# Patient Record
Sex: Female | Born: 1970 | Hispanic: No | Smoking: Current every day smoker
Health system: Southern US, Community
[De-identification: ages and names within clinical notes are randomized; demographics above are authoritative.]

## PROBLEM LIST (undated history)

## (undated) DIAGNOSIS — Z3491 Encounter for supervision of normal pregnancy, unspecified, first trimester: Principal | ICD-10-CM

## (undated) DIAGNOSIS — B9689 Other specified bacterial agents as the cause of diseases classified elsewhere: Secondary | ICD-10-CM

## (undated) DIAGNOSIS — N898 Other specified noninflammatory disorders of vagina: Secondary | ICD-10-CM

## (undated) DIAGNOSIS — R109 Unspecified abdominal pain: Secondary | ICD-10-CM

## (undated) DIAGNOSIS — O039 Complete or unspecified spontaneous abortion without complication: Secondary | ICD-10-CM

## (undated) DIAGNOSIS — O09529 Supervision of elderly multigravida, unspecified trimester: Secondary | ICD-10-CM

## (undated) DIAGNOSIS — E756 Lipid storage disorder, unspecified: Secondary | ICD-10-CM

## (undated) DIAGNOSIS — M25519 Pain in unspecified shoulder: Secondary | ICD-10-CM

## (undated) DIAGNOSIS — E8809 Other disorders of plasma-protein metabolism, not elsewhere classified: Secondary | ICD-10-CM

## (undated) DIAGNOSIS — Z3490 Encounter for supervision of normal pregnancy, unspecified, unspecified trimester: Secondary | ICD-10-CM

## (undated) DIAGNOSIS — N760 Acute vaginitis: Secondary | ICD-10-CM

## (undated) HISTORY — DX: Encounter for supervision of normal pregnancy, unspecified, unspecified trimester: Z34.90

## (undated) HISTORY — DX: Encounter for supervision of normal pregnancy, unspecified, first trimester: Z34.91

## (undated) HISTORY — DX: Unspecified abdominal pain: R10.9

## (undated) HISTORY — DX: Other specified noninflammatory disorders of vagina: N89.8

## (undated) HISTORY — PX: ANKLE SURGERY: SHX546

## (undated) HISTORY — PX: TONSILLECTOMY AND ADENOIDECTOMY: SHX28

## (undated) HISTORY — DX: Acute vaginitis: N76.0

## (undated) HISTORY — DX: Other specified bacterial agents as the cause of diseases classified elsewhere: B96.89

## (undated) HISTORY — DX: Complete or unspecified spontaneous abortion without complication: O03.9

## (undated) HISTORY — DX: Supervision of elderly multigravida, unspecified trimester: O09.529

## (undated) HISTORY — DX: Pain in unspecified shoulder: M25.519

---

## 2013-12-17 ENCOUNTER — Ambulatory Visit (INDEPENDENT_AMBULATORY_CARE_PROVIDER_SITE_OTHER): Payer: 59

## 2013-12-17 ENCOUNTER — Encounter (INDEPENDENT_AMBULATORY_CARE_PROVIDER_SITE_OTHER): Payer: Self-pay

## 2013-12-17 ENCOUNTER — Encounter: Payer: Self-pay | Admitting: Obstetrics & Gynecology

## 2013-12-17 ENCOUNTER — Ambulatory Visit (INDEPENDENT_AMBULATORY_CARE_PROVIDER_SITE_OTHER): Payer: 59 | Admitting: Obstetrics & Gynecology

## 2013-12-17 ENCOUNTER — Other Ambulatory Visit: Payer: Self-pay | Admitting: Obstetrics & Gynecology

## 2013-12-17 VITALS — BP 140/82 | Ht 62.0 in | Wt 166.0 lb

## 2013-12-17 DIAGNOSIS — O3680X0 Pregnancy with inconclusive fetal viability, not applicable or unspecified: Secondary | ICD-10-CM

## 2013-12-17 DIAGNOSIS — O09529 Supervision of elderly multigravida, unspecified trimester: Secondary | ICD-10-CM

## 2013-12-17 DIAGNOSIS — Z3201 Encounter for pregnancy test, result positive: Secondary | ICD-10-CM

## 2013-12-17 LAB — POCT URINE PREGNANCY: PREG TEST UR: POSITIVE

## 2013-12-17 NOTE — Progress Notes (Signed)
U/S(5+4wks)-transvaginal u/s performed, single IUP with GS and +YS noted, no embryo noted on today's exam, GS measurements c/w LMP dates, cx appears closed, bilateral adnexa appears wnl with C.L. Noted on Rt, no free fluid noted within pelvis, would like to reck to confirm viability

## 2013-12-17 NOTE — Progress Notes (Signed)
Pt here for pregnancy test. Positive result. Pt reports no complaints at this time. Advised some spotting and cramping can be normal in early pregnancy. Advised if she noticed any, call office. Pt voiced understanding. JSY

## 2013-12-23 ENCOUNTER — Other Ambulatory Visit: Payer: Self-pay | Admitting: Obstetrics & Gynecology

## 2013-12-23 DIAGNOSIS — O3680X0 Pregnancy with inconclusive fetal viability, not applicable or unspecified: Secondary | ICD-10-CM

## 2013-12-24 ENCOUNTER — Other Ambulatory Visit: Payer: Self-pay | Admitting: Obstetrics & Gynecology

## 2013-12-24 ENCOUNTER — Other Ambulatory Visit (HOSPITAL_COMMUNITY)
Admission: RE | Admit: 2013-12-24 | Discharge: 2013-12-24 | Disposition: A | Discharge status: Home / Self Care | Payer: 59 | Source: Ambulatory Visit | Attending: Adult Health | Admitting: Adult Health

## 2013-12-24 ENCOUNTER — Ambulatory Visit (INDEPENDENT_AMBULATORY_CARE_PROVIDER_SITE_OTHER): Payer: 59

## 2013-12-24 ENCOUNTER — Ambulatory Visit (INDEPENDENT_AMBULATORY_CARE_PROVIDER_SITE_OTHER): Payer: 59 | Admitting: Adult Health

## 2013-12-24 ENCOUNTER — Encounter: Payer: Self-pay | Admitting: Adult Health

## 2013-12-24 VITALS — BP 138/82 | Wt 168.0 lb

## 2013-12-24 DIAGNOSIS — Z113 Encounter for screening for infections with a predominantly sexual mode of transmission: Secondary | ICD-10-CM | POA: Insufficient documentation

## 2013-12-24 DIAGNOSIS — Z331 Pregnant state, incidental: Secondary | ICD-10-CM

## 2013-12-24 DIAGNOSIS — Z01419 Encounter for gynecological examination (general) (routine) without abnormal findings: Secondary | ICD-10-CM

## 2013-12-24 DIAGNOSIS — O09529 Supervision of elderly multigravida, unspecified trimester: Secondary | ICD-10-CM

## 2013-12-24 DIAGNOSIS — O3680X0 Pregnancy with inconclusive fetal viability, not applicable or unspecified: Secondary | ICD-10-CM

## 2013-12-24 DIAGNOSIS — Z1389 Encounter for screening for other disorder: Secondary | ICD-10-CM

## 2013-12-24 DIAGNOSIS — Z3480 Encounter for supervision of other normal pregnancy, unspecified trimester: Secondary | ICD-10-CM

## 2013-12-24 DIAGNOSIS — Z1151 Encounter for screening for human papillomavirus (HPV): Secondary | ICD-10-CM | POA: Insufficient documentation

## 2013-12-24 DIAGNOSIS — Z3491 Encounter for supervision of normal pregnancy, unspecified, first trimester: Secondary | ICD-10-CM

## 2013-12-24 HISTORY — DX: Encounter for supervision of normal pregnancy, unspecified, first trimester: Z34.91

## 2013-12-24 HISTORY — DX: Supervision of elderly multigravida, unspecified trimester: O09.529

## 2013-12-24 LAB — POCT URINALYSIS DIPSTICK
Glucose, UA: NEGATIVE
Ketones, UA: NEGATIVE
LEUKOCYTES UA: NEGATIVE
Nitrite, UA: NEGATIVE
PROTEIN UA: NEGATIVE
RBC UA: NEGATIVE

## 2013-12-24 LAB — CBC
HCT: 40 % (ref 36.0–46.0)
HEMOGLOBIN: 13.6 g/dL (ref 12.0–15.0)
MCH: 30.9 pg (ref 26.0–34.0)
MCHC: 34 g/dL (ref 30.0–36.0)
MCV: 90.9 fL (ref 78.0–100.0)
Platelets: 383 10*3/uL (ref 150–400)
RBC: 4.4 MIL/uL (ref 3.87–5.11)
RDW: 13 % (ref 11.5–15.5)
WBC: 10.7 10*3/uL — ABNORMAL HIGH (ref 4.0–10.5)

## 2013-12-24 NOTE — Progress Notes (Signed)
U/S-single IUP noted, Intrauterine GS noted with +embryo &  YS noted, +CA noted although unable to pick up with Doppler, cx appears closed, bilateral adnexa appears wnl, would like to reck for definite viability

## 2013-12-24 NOTE — Patient Instructions (Signed)

## 2013-12-24 NOTE — Progress Notes (Signed)
  Subjective:    Kimberly DanesKristi Eaton is a [id number] [id number] being seen today for her first obstetrical visit.  Her obstetrical history is significant for advanced maternal age.  Pregnancy history fully reviewed.  Patient reports no complaints.  Filed Vitals:   12/24/13 1259  BP: 138/82  Weight: 168 lb (76.204 kg)    HISTORY: OB History  Gravida Para Term Preterm AB SAB TAB Ectopic Multiple Living  3 2 2       2     # Outcome Date GA Lbr Len/2nd Weight Sex Delivery Anes PTL Lv  3 CUR           2 TRM 06/24/02 7358w0d  6 lb 3 oz (2.807 kg) M SVD None  Y  1 TRM 02/21/99 8417w0d  5 lb 8 oz (2.495 kg) M SVD None  Y     Past Medical History  Diagnosis Date  . Supervision of normal pregnancy in first trimester 12/24/2013   Past Surgical History  Procedure Laterality Date  . Tonsillectomy and adenoidectomy    . Ankle surgery Left    Family History  Problem Relation Age of Onset  . Diabetes Maternal Uncle   . Cancer Maternal Grandmother   . Cancer Maternal Grandfather     skin  . Stroke Maternal Uncle      Exam       Pelvic Exam:    Perineum: Normal Perineum   Vulva: normal   Vagina:  normal mucosa, normal discharge, no palpable nodules   Uterus 6 weeeks        Cervix: Normal pap with HPV and GC/CHL done   Adnexa: Not palpable   Urinary:  urethral meatus normal    System: Breast:  deferred   Skin: normal coloration and turgor, no rashes    Neurologic: oriented, normal, normal mood   Extremities: normal strength, tone, and muscle mass   HEENT PERRLA  normal thyroid   Mouth/Teeth mucous membranes moist, pharynx normal without lesions   Neck supple and no masses   Cardiovascular: regular rate and rhythm   Respiratory:  appears well, vitals normal, no respiratory distress, acyanotic, normal RR   Abdomen: soft, non-tender; bowel sounds normal; no masses,  no organomegaly          Assessment:    Pregnancy: [id number] Patient Active Problem List   Diagnosis Date Noted  .  Supervision of normal pregnancy in first trimester 12/24/2013        Plan:     Initial labs drawn. Prenatal vitamins. Problem list reviewed and updated. Genetic Screening discussed Integrated Screen: declined.CF declined, flu shot declined  Ultrasound discussed; fetal survey: requested.  Follow up in 1 weeks for US and see me [facility] Wiley Magan,NP 12/24/2013

## 2013-12-25 LAB — DRUG SCREEN, URINE, NO CONFIRMATION
Amphetamine Screen, Ur: NEGATIVE
Barbiturate Quant, Ur: NEGATIVE
Benzodiazepines.: NEGATIVE
Cocaine Metabolites: NEGATIVE
Creatinine,U: 266.2 mg/dL
Marijuana Metabolite: NEGATIVE
Methadone: NEGATIVE
Opiate Screen, Urine: NEGATIVE
Phencyclidine (PCP): NEGATIVE
Propoxyphene: NEGATIVE

## 2013-12-25 LAB — TSH: TSH: 1.417 u[IU]/mL (ref 0.350–4.500)

## 2013-12-25 LAB — URINALYSIS, ROUTINE W REFLEX MICROSCOPIC
Bilirubin Urine: NEGATIVE
GLUCOSE, UA: NEGATIVE mg/dL
Hgb urine dipstick: NEGATIVE
Ketones, ur: NEGATIVE mg/dL
Leukocytes, UA: NEGATIVE
Nitrite: NEGATIVE
PH: 5.5 (ref 5.0–8.0)
Protein, ur: NEGATIVE mg/dL
Specific Gravity, Urine: 1.028 (ref 1.005–1.030)
Urobilinogen, UA: 0.2 mg/dL (ref 0.0–1.0)

## 2013-12-25 LAB — HIV ANTIBODY (ROUTINE TESTING W REFLEX): HIV: NONREACTIVE

## 2013-12-25 LAB — OXYCODONE SCREEN, UA, RFLX CONFIRM: OXYCODONE SCRN UR: NEGATIVE ng/mL

## 2013-12-25 LAB — VARICELLA ZOSTER ANTIBODY, IGG: Varicella IgG: <10 Index (ref <135.00)

## 2013-12-25 LAB — ABO AND RH: Rh Type: POSITIVE

## 2013-12-25 LAB — RUBELLA SCREEN: Rubella: 29.7 {index} — ABNORMAL HIGH

## 2013-12-25 LAB — HEPATITIS B SURFACE ANTIGEN: HEP B S AG: NEGATIVE

## 2013-12-25 LAB — ANTIBODY SCREEN: Antibody Screen: NEGATIVE

## 2013-12-25 LAB — RPR

## 2013-12-26 LAB — URINE CULTURE
Colony Count: NO GROWTH
Organism ID, Bacteria: NO GROWTH

## 2013-12-31 ENCOUNTER — Ambulatory Visit (INDEPENDENT_AMBULATORY_CARE_PROVIDER_SITE_OTHER): Payer: 59 | Admitting: Adult Health

## 2013-12-31 ENCOUNTER — Other Ambulatory Visit: Payer: Self-pay | Admitting: Adult Health

## 2013-12-31 ENCOUNTER — Ambulatory Visit (INDEPENDENT_AMBULATORY_CARE_PROVIDER_SITE_OTHER): Payer: 59

## 2013-12-31 VITALS — BP 138/78 | Wt 169.0 lb

## 2013-12-31 DIAGNOSIS — Z1389 Encounter for screening for other disorder: Secondary | ICD-10-CM

## 2013-12-31 DIAGNOSIS — O09529 Supervision of elderly multigravida, unspecified trimester: Secondary | ICD-10-CM

## 2013-12-31 DIAGNOSIS — O3680X0 Pregnancy with inconclusive fetal viability, not applicable or unspecified: Secondary | ICD-10-CM

## 2013-12-31 DIAGNOSIS — Z331 Pregnant state, incidental: Secondary | ICD-10-CM

## 2013-12-31 DIAGNOSIS — Z3491 Encounter for supervision of normal pregnancy, unspecified, first trimester: Secondary | ICD-10-CM

## 2013-12-31 LAB — POCT URINALYSIS DIPSTICK
Glucose, UA: NEGATIVE
KETONES UA: NEGATIVE
Nitrite, UA: NEGATIVE
Protein, UA: NEGATIVE
RBC UA: NEGATIVE

## 2013-12-31 NOTE — Patient Instructions (Signed)
Follow up in 1 week for US Call Monday for labs

## 2013-12-31 NOTE — Progress Notes (Signed)
U/S-single IUP with +FCA noted FHR-94bpm, CRL (3.441mm) c/w 5+5 wks, GS measurements c/w 7+2wks, +YS noted, cx appears closed, bilateral adnexa appears wnl, no free fluid noted within pelvis, maternal HR-74 bpm

## 2013-12-31 NOTE — Progress Notes (Signed)
No bleeding or cramping, had US with +FHM but growth lagging, will check progesterone level and follow up with US in 1 week

## 2014-01-01 LAB — PROGESTERONE: Progesterone: 9.3 ng/mL

## 2014-01-03 ENCOUNTER — Telehealth: Payer: Self-pay | Admitting: Adult Health

## 2014-01-03 MED ORDER — PROGESTERONE MICRONIZED 100 MG PO CAPS: ORAL_CAPSULE | ORAL | Status: DC

## 2014-01-03 NOTE — Telephone Encounter (Signed)
Progesterone level 9.3 called in prometrium 100 mg 1 at hs in vagina

## 2014-01-07 ENCOUNTER — Ambulatory Visit (INDEPENDENT_AMBULATORY_CARE_PROVIDER_SITE_OTHER): Payer: 59 | Admitting: Adult Health

## 2014-01-07 ENCOUNTER — Encounter: Payer: Self-pay | Admitting: Adult Health

## 2014-01-07 VITALS — BP 120/80 | Wt 168.0 lb

## 2014-01-07 DIAGNOSIS — Z1389 Encounter for screening for other disorder: Secondary | ICD-10-CM

## 2014-01-07 DIAGNOSIS — O039 Complete or unspecified spontaneous abortion without complication: Secondary | ICD-10-CM

## 2014-01-07 DIAGNOSIS — Z331 Pregnant state, incidental: Secondary | ICD-10-CM

## 2014-01-07 HISTORY — DX: Complete or unspecified spontaneous abortion without complication: O03.9

## 2014-01-07 LAB — POCT URINALYSIS DIPSTICK
Blood, UA: NEGATIVE
GLUCOSE UA: NEGATIVE
KETONES UA: NEGATIVE
Leukocytes, UA: NEGATIVE
Nitrite, UA: NEGATIVE
Protein, UA: NEGATIVE

## 2014-01-07 NOTE — Progress Notes (Signed)
Has CRL 0.40cm was 3.1 mm 12/31/13 no FCA will get US in 10 days,Pt aware this is miscarriage, can stop prometrium, discussed with Dr Despina HiddenEure.

## 2014-01-07 NOTE — Progress Notes (Signed)
Pt denies any problems or concerns at this time.  

## 2014-01-07 NOTE — Patient Instructions (Signed)
Miscarriage A miscarriage is the sudden loss of an unborn baby (fetus) before the 20th week of pregnancy. Most miscarriages happen in the first 3 months of pregnancy. Sometimes, it happens before a woman even knows she is pregnant. A miscarriage is also called a "spontaneous miscarriage" or "early pregnancy loss." Having a miscarriage can be an emotional experience. Talk with your caregiver about any questions you may have about miscarrying, the grieving process, and your future pregnancy plans. CAUSES   Problems with the fetal chromosomes that make it impossible for the baby to develop normally. Problems with the baby's genes or chromosomes are most often the result of errors that occur, by chance, as the embryo divides and grows. The problems are not inherited from the parents.  Infection of the cervix or uterus.   Hormone problems.   Problems with the cervix, such as having an incompetent cervix. This is when the tissue in the cervix is not strong enough to hold the pregnancy.   Problems with the uterus, such as an abnormally shaped uterus, uterine fibroids, or congenital abnormalities.   Certain medical conditions.   Smoking, drinking alcohol, or taking illegal drugs.   Trauma.  Often, the cause of a miscarriage is unknown.  SYMPTOMS   Vaginal bleeding or spotting, with or without cramps or pain.  Pain or cramping in the abdomen or lower back.  Passing fluid, tissue, or blood clots from the vagina. DIAGNOSIS  Your caregiver will perform a physical exam. You may also have an ultrasound to confirm the miscarriage. Blood or urine tests may also be ordered. TREATMENT   Sometimes, treatment is not necessary if you naturally pass all the fetal tissue that was in the uterus. If some of the fetus or placenta remains in the body (incomplete miscarriage), tissue left behind may become infected and must be removed. Usually, a dilation and curettage (D and C) procedure is performed.  During a D and C procedure, the cervix is widened (dilated) and any remaining fetal or placental tissue is gently removed from the uterus.  Antibiotic medicines are prescribed if there is an infection. Other medicines may be given to reduce the size of the uterus (contract) if there is a lot of bleeding.  If you have Rh negative blood and your baby was Rh positive, you will need a Rh immunoglobulin shot. This shot will protect any future baby from having Rh blood problems in future pregnancies. HOME CARE INSTRUCTIONS   Your caregiver may order bed rest or may allow you to continue light activity. Resume activity as directed by your caregiver.  Have someone help with home and family responsibilities during this time.   Keep track of the number of sanitary pads you use each day and how soaked (saturated) they are. Write down this information.   Do not use tampons. Do not douche or have sexual intercourse until approved by your caregiver.   Only take over-the-counter or prescription medicines for pain or discomfort as directed by your caregiver.   Do not take aspirin. Aspirin can cause bleeding.   Keep all follow-up appointments with your caregiver.   If you or your partner have problems with grieving, talk to your caregiver or seek counseling to help cope with the pregnancy loss. Allow enough time to grieve before trying to get pregnant again.  SEEK IMMEDIATE MEDICAL CARE IF:   You have severe cramps or pain in your back or abdomen.  You have a fever.  You pass large blood clots (walnut-sized   or larger) ortissue from your vagina. Save any tissue for your caregiver to inspect.   Your bleeding increases.   You have a thick, bad-smelling vaginal discharge.  You become lightheaded, weak, or you faint.   You have chills.  MAKE SURE YOU:  Understand these instructions.  Will watch your condition.  Will get help right away if you are not doing well or get  worse. Document Released: 04/30/2001 Document Revised: 03/01/2013 Document Reviewed: 12/24/2011 [facility] Patient Information 2014 [place], [place]. Follow up in 10 days for US  Call prn

## 2014-01-17 ENCOUNTER — Ambulatory Visit (INDEPENDENT_AMBULATORY_CARE_PROVIDER_SITE_OTHER): Payer: 59 | Admitting: Adult Health

## 2014-01-17 ENCOUNTER — Ambulatory Visit (INDEPENDENT_AMBULATORY_CARE_PROVIDER_SITE_OTHER): Payer: 59

## 2014-01-17 ENCOUNTER — Encounter (INDEPENDENT_AMBULATORY_CARE_PROVIDER_SITE_OTHER): Payer: Self-pay

## 2014-01-17 ENCOUNTER — Encounter: Payer: Self-pay | Admitting: Adult Health

## 2014-01-17 VITALS — BP 130/88 | Ht 62.0 in | Wt 170.0 lb

## 2014-01-17 DIAGNOSIS — O039 Complete or unspecified spontaneous abortion without complication: Secondary | ICD-10-CM

## 2014-01-17 DIAGNOSIS — Z3491 Encounter for supervision of normal pregnancy, unspecified, first trimester: Secondary | ICD-10-CM

## 2014-01-17 MED ORDER — MISOPROSTOL 200 MCG PO TABS: ORAL_TABLET | ORAL | Status: DC

## 2014-01-17 MED ORDER — ALPRAZOLAM 0.25 MG PO TABS: 0.2500 mg | ORAL_TABLET | Freq: Every evening | ORAL | Status: DC | PRN

## 2014-01-17 MED ORDER — IBUPROFEN 800 MG PO TABS: 800.0000 mg | ORAL_TABLET | Freq: Three times a day (TID) | ORAL | Status: DC | PRN

## 2014-01-17 MED ORDER — HYDROCODONE-ACETAMINOPHEN 5-325 MG PO TABS: 1.0000 | ORAL_TABLET | Freq: Four times a day (QID) | ORAL | Status: DC | PRN

## 2014-01-17 NOTE — Patient Instructions (Signed)
Miscarriage A miscarriage is the sudden loss of an unborn baby (fetus) before the 20th week of pregnancy. Most miscarriages happen in the first 3 months of pregnancy. Sometimes, it happens before a woman even knows she is pregnant. A miscarriage is also called a "spontaneous miscarriage" or "early pregnancy loss." Having a miscarriage can be an emotional experience. Talk with your caregiver about any questions you may have about miscarrying, the grieving process, and your future pregnancy plans. CAUSES   Problems with the fetal chromosomes that make it impossible for the baby to develop normally. Problems with the baby's genes or chromosomes are most often the result of errors that occur, by chance, as the embryo divides and grows. The problems are not inherited from the parents.  Infection of the cervix or uterus.   Hormone problems.   Problems with the cervix, such as having an incompetent cervix. This is when the tissue in the cervix is not strong enough to hold the pregnancy.   Problems with the uterus, such as an abnormally shaped uterus, uterine fibroids, or congenital abnormalities.   Certain medical conditions.   Smoking, drinking alcohol, or taking illegal drugs.   Trauma.  Often, the cause of a miscarriage is unknown.  SYMPTOMS   Vaginal bleeding or spotting, with or without cramps or pain.  Pain or cramping in the abdomen or lower back.  Passing fluid, tissue, or blood clots from the vagina. DIAGNOSIS  Your caregiver will perform a physical exam. You may also have an ultrasound to confirm the miscarriage. Blood or urine tests may also be ordered. TREATMENT   Sometimes, treatment is not necessary if you naturally pass all the fetal tissue that was in the uterus. If some of the fetus or placenta remains in the body (incomplete miscarriage), tissue left behind may become infected and must be removed. Usually, a dilation and curettage (D and C) procedure is performed.  During a D and C procedure, the cervix is widened (dilated) and any remaining fetal or placental tissue is gently removed from the uterus.  Antibiotic medicines are prescribed if there is an infection. Other medicines may be given to reduce the size of the uterus (contract) if there is a lot of bleeding.  If you have Rh negative blood and your baby was Rh positive, you will need a Rh immunoglobulin shot. This shot will protect any future baby from having Rh blood problems in future pregnancies. HOME CARE INSTRUCTIONS   Your caregiver may order bed rest or may allow you to continue light activity. Resume activity as directed by your caregiver.  Have someone help with home and family responsibilities during this time.   Keep track of the number of sanitary pads you use each day and how soaked (saturated) they are. Write down this information.   Do not use tampons. Do not douche or have sexual intercourse until approved by your caregiver.   Only take over-the-counter or prescription medicines for pain or discomfort as directed by your caregiver.   Do not take aspirin. Aspirin can cause bleeding.   Keep all follow-up appointments with your caregiver.   If you or your partner have problems with grieving, talk to your caregiver or seek counseling to help cope with the pregnancy loss. Allow enough time to grieve before trying to get pregnant again.  SEEK IMMEDIATE MEDICAL CARE IF:   You have severe cramps or pain in your back or abdomen.  You have a fever.  You pass large blood clots (walnut-sized   or larger) ortissue from your vagina. Save any tissue for your caregiver to inspect.   Your bleeding increases.   You have a thick, bad-smelling vaginal discharge.  You become lightheaded, weak, or you faint.   You have chills.  MAKE SURE YOU:  Understand these instructions.  Will watch your condition.  Will get help right away if you are not doing well or get  worse. Document Released: 04/30/2001 Document Revised: 03/01/2013 Document Reviewed: 12/24/2011 [facility] Patient Information 2014 [place], [place]. Take cytotec 4 at once Follow up in 2 weeks

## 2014-01-17 NOTE — Progress Notes (Signed)
Subjective:     Patient ID: Warren DanesKristi McLeod, female   DOB: 07/20/1971, 43 y.o.   MRN: [id number]  HPI Silva BandyKristi is a 43 year old white female back for US for probable miscarriage.She is teary today and says her emotions are all over the place.She desires another pregnancy.   Review of Systems See HPI Reviewed past medical,surgical, social and family history. Reviewed medications and allergies.     Objective:   Physical Exam BP 130/88  Ht 5\' 2"  (1.575 m)  Wt 170 lb (77.111 kg)  BMI 31.09 kg/m2  LMP 11/08/2013   Reviewed US with pt, has 30 mm sac, no fetal pole or FCA seen, has +YS and now has ?hemorrhage beside sac representing evolving miscarriage, discussed options of waiting or using cytotec to speed up process, will proceed with cytotec, and discussed with Dr Despina HiddenEure.  Assessment:     Miscarriage     Plan:    Use condoms, and wait 3 months to try again, keep taking prenatals Rx Cytotec 200 mcg #4 4 po now with 1 refill Rx norco 5/325 mg #15 1 every 6 hours prn pain no refills Rx motrin 800 mg #60 1 every 6-8 hours prn with 1 refill Rx xanax 0.25 mg #30 1 at hs prn no refills Follow up in 2 weeks Review handout on miscarriage

## 2014-01-17 NOTE — Progress Notes (Signed)
U/S-transvaginal u/s performed, single Intrauterine GS noted with +YS noted within, GS meas c/w 8+1wks (30.630mm), No positively identified fetal pole noted, cx appears closed, small amount of hemorrhage noted adjacent to GS, bilateral adnexa/ovaries appears wnl, no free fluid noted

## 2014-01-26 LAB — US OB LIMITED

## 2014-02-01 ENCOUNTER — Encounter (INDEPENDENT_AMBULATORY_CARE_PROVIDER_SITE_OTHER): Payer: Self-pay

## 2014-02-01 ENCOUNTER — Ambulatory Visit (INDEPENDENT_AMBULATORY_CARE_PROVIDER_SITE_OTHER): Payer: 59 | Admitting: Adult Health

## 2014-02-01 ENCOUNTER — Encounter: Payer: Self-pay | Admitting: Adult Health

## 2014-02-01 VITALS — BP 118/72 | Ht 62.0 in | Wt 169.0 lb

## 2014-02-01 DIAGNOSIS — M25519 Pain in unspecified shoulder: Secondary | ICD-10-CM

## 2014-02-01 DIAGNOSIS — O039 Complete or unspecified spontaneous abortion without complication: Secondary | ICD-10-CM

## 2014-02-01 HISTORY — DX: Pain in unspecified shoulder: M25.519

## 2014-02-01 NOTE — Patient Instructions (Signed)
appt with Dr Frederico Hammananiel Caffrey 3/19 at 10:30 am Return in 3 weeks for follow up

## 2014-02-01 NOTE — Progress Notes (Signed)
Subjective:     Patient ID: Kimberly Eaton, female   DOB: 21-Sep-1971, 43 y.o.   MRN: [id number]  HPI Kimberly Eaton is back sp miscarriage, she says she feels better emotionally.  Review of Systems See HPI Reviewed past medical,surgical, social and family history. Reviewed medications and allergies.     Objective:   Physical Exam BP 118/72  Ht 5\' 2"  (1.575 m)  Wt 169 lb (76.658 kg)  BMI 30.90 kg/m2  LMP 11/08/2013   Passed tissue after taking Cytotec, only took once, complains of pain right shoulder when she raises it and has ?bone spur at joint that is point tender.  Assessment:     Miscarriage Pain right shoulder    Plan:     Check QHCG Return in 3 weeks in follow up Refer to Dr Frederico Hammananiel Caffrey 3/19 at 10:30 am to evaluate

## 2014-02-02 LAB — HCG, QUANTITATIVE, PREGNANCY: hCG, Beta Chain, Quant, S: 306.1 m[IU]/mL

## 2014-02-03 ENCOUNTER — Telehealth: Payer: Self-pay | Admitting: Adult Health

## 2014-02-03 NOTE — Telephone Encounter (Signed)
Left message QHCG 306.1, will repeat in 3 weeks at F/U appt

## 2014-02-22 ENCOUNTER — Telehealth: Payer: Self-pay | Admitting: Adult Health

## 2014-02-22 NOTE — Telephone Encounter (Signed)
Doing good will reschedule labs for next week, had shoulder injected and it is good too.

## 2014-02-23 ENCOUNTER — Ambulatory Visit: Payer: 59 | Admitting: Adult Health

## 2014-02-25 ENCOUNTER — Ambulatory Visit: Payer: 59 | Admitting: Adult Health

## 2014-09-19 ENCOUNTER — Encounter: Payer: Self-pay | Admitting: Adult Health

## 2014-10-22 ENCOUNTER — Encounter (HOSPITAL_COMMUNITY): Payer: Self-pay | Admitting: *Deleted

## 2014-11-28 ENCOUNTER — Encounter: Payer: Self-pay | Admitting: Adult Health

## 2014-11-28 ENCOUNTER — Ambulatory Visit (INDEPENDENT_AMBULATORY_CARE_PROVIDER_SITE_OTHER): Payer: 59 | Admitting: Adult Health

## 2014-11-28 VITALS — BP 174/80 | Ht 62.0 in | Wt 168.5 lb

## 2014-11-28 DIAGNOSIS — N898 Other specified noninflammatory disorders of vagina: Secondary | ICD-10-CM

## 2014-11-28 DIAGNOSIS — A499 Bacterial infection, unspecified: Secondary | ICD-10-CM

## 2014-11-28 DIAGNOSIS — N760 Acute vaginitis: Secondary | ICD-10-CM

## 2014-11-28 DIAGNOSIS — R109 Unspecified abdominal pain: Secondary | ICD-10-CM

## 2014-11-28 DIAGNOSIS — B9689 Other specified bacterial agents as the cause of diseases classified elsewhere: Secondary | ICD-10-CM

## 2014-11-28 HISTORY — DX: Other specified noninflammatory disorders of vagina: N89.8

## 2014-11-28 HISTORY — DX: Unspecified abdominal pain: R10.9

## 2014-11-28 HISTORY — DX: Other specified bacterial agents as the cause of diseases classified elsewhere: B96.89

## 2014-11-28 LAB — POCT WET PREP (WET MOUNT): WBC, Wet Prep HPF POC: POSITIVE

## 2014-11-28 MED ORDER — METRONIDAZOLE 500 MG PO TABS: 500.0000 mg | ORAL_TABLET | Freq: Two times a day (BID) | ORAL | Status: DC

## 2014-11-28 NOTE — Progress Notes (Signed)
Subjective:     Patient ID: Kimberly Eaton, female   DOB: 1971-09-13, 44 y.o.   MRN: [id number]  HPI Kimberly Eaton is a 44 year old white female in complaining of stomach cramps and pain with sex depending on position.Still wants to get pregnant.  Review of Systems See HPI Reviewed past medical,surgical, social and family history. Reviewed medications and allergies.     Objective:   Physical Exam BP 174/80 mmHg  Ht 5\' 2"  (1.575 m)  Wt 168 lb 8 oz (76.431 kg)  BMI 30.81 kg/m2  LMP 11/09/2014  Breastfeeding? No   Skin warm and dry.Pelvic: external genitalia is normal in appearance, vagina: white discharge with odor, cervix:smooth and bulbous, uterus: normal size, shape and contour, non tender, no masses felt, adnexa: no masses or tenderness noted. Wet prep: + for clue cells and +WBCs.GC/CHL sent.  Assessment:     Stomach cramps Vaginal discharge BV    Plan:    Rx flagyl 500 mg 1 bid x 7 days, no alcohol, review handout on BV   No sex during treatment Call with next period will check progesterone day 21

## 2014-11-28 NOTE — Addendum Note (Signed)
Addended by: Cyril MourningGRIFFIN, Mckynlee Luse A on: 11/28/2014 03:15 PM   Modules accepted: Orders

## 2014-11-28 NOTE — Patient Instructions (Addendum)
Bacterial Vaginosis Bacterial vaginosis is a vaginal infection that occurs when the normal balance of bacteria in the vagina is disrupted. It results from an overgrowth of certain bacteria. This is the most common vaginal infection in women of childbearing age. Treatment is important to prevent complications, especially in pregnant women, as it can cause a premature delivery. CAUSES  Bacterial vaginosis is caused by an increase in harmful bacteria that are normally present in smaller amounts in the vagina. Several different kinds of bacteria can cause bacterial vaginosis. However, the reason that the condition develops is not fully understood. RISK FACTORS Certain activities or behaviors can put you at an increased risk of developing bacterial vaginosis, including:  Having a new sex partner or multiple sex partners.  Douching.  Using an intrauterine device (IUD) for contraception. Women do not get bacterial vaginosis from toilet seats, bedding, swimming pools, or contact with objects around them. SIGNS AND SYMPTOMS  Some women with bacterial vaginosis have no signs or symptoms. Common symptoms include:  Grey vaginal discharge.  A fishlike odor with discharge, especially after sexual intercourse.  Itching or burning of the vagina and vulva.  Burning or pain with urination. DIAGNOSIS  Your health care provider will take a medical history and examine the vagina for signs of bacterial vaginosis. A sample of vaginal fluid may be taken. Your health care provider will look at this sample under a microscope to check for bacteria and abnormal cells. A vaginal pH test may also be done.  TREATMENT  Bacterial vaginosis may be treated with antibiotic medicines. These may be given in the form of a pill or a vaginal cream. A second round of antibiotics may be prescribed if the condition comes back after treatment.  HOME CARE INSTRUCTIONS   Only take over-the-counter or prescription medicines as  directed by your health care provider.  If antibiotic medicine was prescribed, take it as directed. Make sure you finish it even if you start to feel better.  Do not have sex until treatment is completed.  Tell all sexual partners that you have a vaginal infection. They should see their health care provider and be treated if they have problems, such as a mild rash or itching.  Practice safe sex by using condoms and only having one sex partner. SEEK MEDICAL CARE IF:   Your symptoms are not improving after 3 days of treatment.  You have increased discharge or pain.  You have a fever. MAKE SURE YOU:   Understand these instructions.  Will watch your condition.  Will get help right away if you are not doing well or get worse. FOR MORE INFORMATION  Centers for Disease Control and Prevention, Division of STD Prevention: [employer]s.co.zawww.cdc.gov/std American Sexual Health Association (ASHA): www.ashastd.org  Document Released: 11/04/2005 Document Revised: 08/25/2013 Document Reviewed: 06/16/2013 [facility] Patient Information 2015 [place]ExitCare, [place]LL[place]. This information is not intended to replace advice given to you by your health care provider. Make sure you discuss any questions you have with your health care provider. No alcohol No sex during treatment Call with period

## 2014-11-30 LAB — GC/CHLAMYDIA PROBE AMP
CT PROBE, AMP APTIMA: NEGATIVE
GC Probe RNA: NEGATIVE

## 2014-12-07 ENCOUNTER — Telehealth: Payer: Self-pay | Admitting: Adult Health

## 2014-12-07 NOTE — Telephone Encounter (Signed)
Check progesterone level February 10, will be out of town so get it 2/12

## 2014-12-30 ENCOUNTER — Other Ambulatory Visit: Payer: Medicaid Other

## 2014-12-30 DIAGNOSIS — Z319 Encounter for procreative management, unspecified: Secondary | ICD-10-CM

## 2014-12-31 LAB — PROGESTERONE: Progesterone: 12.1 ng/mL

## 2015-01-03 ENCOUNTER — Telehealth: Payer: Self-pay | Admitting: Adult Health

## 2015-01-03 NOTE — Telephone Encounter (Signed)
Pt aware progesterone 12.1 have sex day 7-24 every other day, [facility], will let me know

## 2015-02-06 ENCOUNTER — Telehealth: Payer: Self-pay | Admitting: Adult Health

## 2015-02-06 MED ORDER — CLOMIPHENE CITRATE 50 MG PO TABS: 50.0000 mg | ORAL_TABLET | Freq: Every day | ORAL | Status: DC

## 2015-02-06 NOTE — Telephone Encounter (Signed)
Started 3/18 wants to try clomid, will rx 50 mg take 1 daily x 5 days, have sex every other day, day 7-24, [facility],

## 2015-04-11 ENCOUNTER — Ambulatory Visit (INDEPENDENT_AMBULATORY_CARE_PROVIDER_SITE_OTHER): Payer: Medicaid Other | Admitting: Adult Health

## 2015-04-11 ENCOUNTER — Encounter: Payer: Self-pay | Admitting: Adult Health

## 2015-04-11 VITALS — BP 136/90 | HR 76 | Ht 62.0 in | Wt 168.0 lb

## 2015-04-11 DIAGNOSIS — Z3201 Encounter for pregnancy test, result positive: Secondary | ICD-10-CM

## 2015-04-11 DIAGNOSIS — O09521 Supervision of elderly multigravida, first trimester: Secondary | ICD-10-CM

## 2015-04-11 DIAGNOSIS — O3680X0 Pregnancy with inconclusive fetal viability, not applicable or unspecified: Secondary | ICD-10-CM

## 2015-04-11 DIAGNOSIS — N926 Irregular menstruation, unspecified: Secondary | ICD-10-CM | POA: Diagnosis not present

## 2015-04-11 DIAGNOSIS — Z3490 Encounter for supervision of normal pregnancy, unspecified, unspecified trimester: Secondary | ICD-10-CM

## 2015-04-11 HISTORY — DX: Encounter for supervision of normal pregnancy, unspecified, unspecified trimester: Z34.90

## 2015-04-11 LAB — POCT URINE PREGNANCY: Preg Test, Ur: POSITIVE

## 2015-04-11 NOTE — Progress Notes (Signed)
Subjective:     Patient ID: Kimberly Eaton, female   DOB: July 01, 1971, 44 y.o.   MRN: [id number]  HPI Kimberly BandyKristi is a 44 year old white female in for UPT, she has used clomid for 2 cycles and had a missed period, with +HPT last week, she has had breast tenderness and nausea and some cramping no bleeding.  Review of Systems See HPI for positives, all other systems negative Reviewed past medical,surgical, social and family history. Reviewed medications and allergies.     Objective:   Physical Exam BP 136/90 mmHg  Pulse 76  Ht 5\' 2"  (1.575 m)  Wt 168 lb (76.204 kg)  BMI 30.72 kg/m2  LMP 03/04/2015 UPT +, about 5+3 weeks by LMP with EDD 12/09/15, medicaid form given, Skin warm and dry. Lungs: clear to ausculation bilaterally. Cardiovascular: regular rate and rhythm.    Assessment:     Pregnant AMA    Plan:    She is already taking prenatal vitamins  Check QHCG and progesterone Return in 2 weeks for dating US Review handout on first trimester

## 2015-04-11 NOTE — Patient Instructions (Signed)
First Trimester of Pregnancy The first trimester of pregnancy is from week 1 until the end of week 12 (months 1 through 3). A week after a sperm fertilizes an egg, the egg will implant on the wall of the uterus. This embryo will begin to develop into a baby. Genes from you and your partner are forming the baby. The female genes determine whether the baby is a boy or a girl. At 6-8 weeks, the eyes and face are formed, and the heartbeat can be seen on ultrasound. At the end of 12 weeks, all the baby's organs are formed.  Now that you are pregnant, you will want to do everything you can to have a healthy baby. Two of the most important things are to get good prenatal care and to follow your health care provider's instructions. Prenatal care is all the medical care you receive before the baby's birth. This care will help prevent, find, and treat any problems during the pregnancy and childbirth. BODY CHANGES Your body goes through many changes during pregnancy. The changes vary from woman to woman.   You may gain or lose a couple of pounds at first.  You may feel sick to your stomach (nauseous) and throw up (vomit). If the vomiting is uncontrollable, call your health care provider.  You may tire easily.  You may develop headaches that can be relieved by medicines approved by your health care provider.  You may urinate more often. Painful urination may mean you have a bladder infection.  You may develop heartburn as a result of your pregnancy.  You may develop constipation because certain hormones are causing the muscles that push waste through your intestines to slow down.  You may develop hemorrhoids or swollen, bulging veins (varicose veins).  Your breasts may begin to grow larger and become tender. Your nipples may stick out more, and the tissue that surrounds them (areola) may become darker.  Your gums may bleed and may be sensitive to brushing and flossing.  Dark spots or blotches (chloasma,  mask of pregnancy) may develop on your face. This will likely fade after the baby is born.  Your menstrual periods will stop.  You may have a loss of appetite.  You may develop cravings for certain kinds of food.  You may have changes in your emotions from day to day, such as being excited to be pregnant or being concerned that something may go wrong with the pregnancy and baby.  You may have more vivid and strange dreams.  You may have changes in your hair. These can include thickening of your hair, rapid growth, and changes in texture. Some women also have hair loss during or after pregnancy, or hair that feels dry or thin. Your hair will most likely return to normal after your baby is born. WHAT TO EXPECT AT YOUR PRENATAL VISITS During a routine prenatal visit:  You will be weighed to make sure you and the baby are growing normally.  Your blood pressure will be taken.  Your abdomen will be measured to track your baby's growth.  The fetal heartbeat will be listened to starting around week 10 or 12 of your pregnancy.  Test results from any previous visits will be discussed. Your health care provider may ask you:  How you are feeling.  If you are feeling the baby move.  If you have had any abnormal symptoms, such as leaking fluid, bleeding, severe headaches, or abdominal cramping.  If you have any questions. Other tests   that may be performed during your first trimester include:  Blood tests to find your blood type and to check for the presence of any previous infections. They will also be used to check for low iron levels (anemia) and Rh antibodies. Later in the pregnancy, blood tests for diabetes will be done along with other tests if problems develop.  Urine tests to check for infections, diabetes, or protein in the urine.  An ultrasound to confirm the proper growth and development of the baby.  An amniocentesis to check for possible genetic problems.  Fetal screens for  spina bifida and Down syndrome.  You may need other tests to make sure you and the baby are doing well. HOME CARE INSTRUCTIONS  Medicines  Follow your health care provider's instructions regarding medicine use. Specific medicines may be either safe or unsafe to take during pregnancy.  Take your prenatal vitamins as directed.  If you develop constipation, try taking a stool softener if your health care provider approves. Diet  Eat regular, well-balanced meals. Choose a variety of foods, such as meat or vegetable-based protein, fish, milk and low-fat dairy products, vegetables, fruits, and whole grain breads and cereals. Your health care provider will help you determine the amount of weight gain that is right for you.  Avoid raw meat and uncooked cheese. These carry germs that can cause birth defects in the baby.  Eating four or five small meals rather than three large meals a day may help relieve nausea and vomiting. If you start to feel nauseous, eating a few soda crackers can be helpful. Drinking liquids between meals instead of during meals also seems to help nausea and vomiting.  If you develop constipation, eat more high-fiber foods, such as fresh vegetables or fruit and whole grains. Drink enough fluids to keep your urine clear or pale yellow. Activity and Exercise  Exercise only as directed by your health care provider. Exercising will help you:  Control your weight.  Stay in shape.  Be prepared for labor and delivery.  Experiencing pain or cramping in the lower abdomen or low back is a good sign that you should stop exercising. Check with your health care provider before continuing normal exercises.  Try to avoid standing for long periods of time. Move your legs often if you must stand in one place for a long time.  Avoid heavy lifting.  Wear low-heeled shoes, and practice good posture.  You may continue to have sex unless your health care provider directs you  otherwise. Relief of Pain or Discomfort  Wear a good support bra for breast tenderness.   Take warm sitz baths to soothe any pain or discomfort caused by hemorrhoids. Use hemorrhoid cream if your health care provider approves.   Rest with your legs elevated if you have leg cramps or low back pain.  If you develop varicose veins in your legs, wear support hose. Elevate your feet for 15 minutes, 3-4 times a day. Limit salt in your diet. Prenatal Care  Schedule your prenatal visits by the twelfth week of pregnancy. They are usually scheduled monthly at first, then more often in the last 2 months before delivery.  Write down your questions. Take them to your prenatal visits.  Keep all your prenatal visits as directed by your health care provider. Safety  Wear your seat belt at all times when driving.  Make a list of emergency phone numbers, including numbers for family, friends, the hospital, and police and fire departments. General Tips    Ask your health care provider for a referral to a local prenatal education class. Begin classes no later than at the beginning of month 6 of your pregnancy.  Ask for help if you have counseling or nutritional needs during pregnancy. Your health care provider can offer advice or refer you to specialists for help with various needs.  Do not use hot tubs, steam rooms, or saunas.  Do not douche or use tampons or scented sanitary pads.  Do not cross your legs for long periods of time.  Avoid cat litter boxes and soil used by cats. These carry germs that can cause birth defects in the baby and possibly loss of the fetus by miscarriage or stillbirth.  Avoid all smoking, herbs, alcohol, and medicines not prescribed by your health care provider. Chemicals in these affect the formation and growth of the baby.  Schedule a dentist appointment. At home, brush your teeth with a soft toothbrush and be gentle when you floss. SEEK MEDICAL CARE IF:   You have  dizziness.  You have mild pelvic cramps, pelvic pressure, or nagging pain in the abdominal area.  You have persistent nausea, vomiting, or diarrhea.  You have a bad smelling vaginal discharge.  You have pain with urination.  You notice increased swelling in your face, hands, legs, or ankles. SEEK IMMEDIATE MEDICAL CARE IF:   You have a fever.  You are leaking fluid from your vagina.  You have spotting or bleeding from your vagina.  You have severe abdominal cramping or pain.  You have rapid weight gain or loss.  You vomit blood or material that looks like coffee grounds.  You are exposed to [place] measles and have never had them.  You are exposed to fifth disease or chickenpox.  You develop a severe headache.  You have shortness of breath.  You have any kind of trauma, such as from a fall or a car accident. Document Released: 10/29/2001 Document Revised: 03/21/2014 Document Reviewed: 09/14/2013 [facility] Patient Information 2015 [employer], [place]. This information is not intended to replace advice given to you by your health care provider. Make sure you discuss any questions you have with your health care provider. US in 2 weeks

## 2015-04-12 LAB — PROGESTERONE: Progesterone: 70.6 ng/mL

## 2015-04-12 LAB — BETA HCG QUANT (REF LAB): hCG Quant: 3493 m[IU]/mL

## 2015-04-14 ENCOUNTER — Telehealth: Payer: Self-pay | Admitting: Adult Health

## 2015-04-14 NOTE — Telephone Encounter (Signed)
Pt aware of labs appt 6/7 for [place]

## 2015-04-25 ENCOUNTER — Ambulatory Visit (INDEPENDENT_AMBULATORY_CARE_PROVIDER_SITE_OTHER): Payer: Medicaid Other

## 2015-04-25 DIAGNOSIS — O3680X0 Pregnancy with inconclusive fetal viability, not applicable or unspecified: Secondary | ICD-10-CM

## 2015-04-25 NOTE — Progress Notes (Signed)
US 6+1WKS single IUP pos fht 89bpm,bradycardia, two simple cyst rt ov #1)3.8 x 3.3 x 3.7cm (#2) 2.6 x 2.1 x 2.0cm,normal lt ov, pt will come back next wk for f/u ultrasound to recheck fht.

## 2015-04-27 ENCOUNTER — Telehealth: Payer: Self-pay | Admitting: *Deleted

## 2015-04-27 NOTE — Telephone Encounter (Signed)
Cramping last night with mucous and pink streaks, then this morning had brown and its brown now,she had, had sex, so no sex for 7 days from last time wiping color, has F/U next week,but may have to reschedule if has court.

## 2015-05-04 ENCOUNTER — Other Ambulatory Visit: Payer: Self-pay | Admitting: Obstetrics and Gynecology

## 2015-05-04 DIAGNOSIS — O3680X1 Pregnancy with inconclusive fetal viability, fetus 1: Secondary | ICD-10-CM

## 2015-05-05 ENCOUNTER — Other Ambulatory Visit: Payer: Medicaid Other

## 2015-05-09 ENCOUNTER — Encounter: Payer: Medicaid Other | Admitting: Women's Health

## 2015-05-15 ENCOUNTER — Ambulatory Visit (INDEPENDENT_AMBULATORY_CARE_PROVIDER_SITE_OTHER): Payer: Medicaid Other | Admitting: Obstetrics and Gynecology

## 2015-05-15 ENCOUNTER — Other Ambulatory Visit: Payer: Self-pay | Admitting: Obstetrics and Gynecology

## 2015-05-15 ENCOUNTER — Ambulatory Visit (INDEPENDENT_AMBULATORY_CARE_PROVIDER_SITE_OTHER): Payer: Medicaid Other

## 2015-05-15 DIAGNOSIS — O021 Missed abortion: Secondary | ICD-10-CM | POA: Diagnosis not present

## 2015-05-15 DIAGNOSIS — O3680X1 Pregnancy with inconclusive fetal viability, fetus 1: Secondary | ICD-10-CM

## 2015-05-15 MED ORDER — MISOPROSTOL 200 MCG PO TABS: 800.0000 ug | ORAL_TABLET | Freq: Once | ORAL | Status: DC

## 2015-05-15 NOTE — Progress Notes (Deleted)
Patient ID: Kimberly Eaton, female   DOB: 1971/06/27, [age] y.o.   MRN: [id number]    [facility]  Patient name: Kimberly Eaton MRN [id number]  Date of birth: 1971/06/27  CC & HPI:  Kimberly Eaton is a 44 y.o. female presenting today for ***  ROS:  ***  Pertinent History Reviewed:   Reviewed: Significant for *** Medical         Past Medical History  Diagnosis Date  . Supervision of normal pregnancy in first trimester 12/24/2013  . AMA (advanced maternal age) multigravida 35+ 12/24/2013  . Miscarriage 01/07/2014  . Shoulder joint pain 02/01/2014    Referred to Dr Madelon Lipsaffrey  . Stomach cramps 11/28/2014  . Vaginal discharge 11/28/2014  . BV (bacterial vaginosis) 11/28/2014  . Pregnant 04/11/2015                              Surgical Hx:    Past Surgical History  Procedure Laterality Date  . Tonsillectomy and adenoidectomy    . Ankle surgery Left    Medications: Reviewed & Updated - see associated section                       Current outpatient prescriptions:  .  misoprostol (CYTOTEC) 200 MCG tablet, Take 4 tablets (800 mcg total) by mouth once., Disp: 4 tablet, Rfl: 1 .  Prenatal Vit-Fe Sulfate-FA (PRENATAL VITAMIN PO), Take by mouth. Takes 2 Prenatal Gummies daily, Disp: , Rfl:    Social History: Reviewed -  reports that she has quit smoking. Her smoking use included Cigarettes. She has never used smokeless tobacco.  Objective Findings:  Vitals: Last menstrual period 03/04/2015.  Physical Examination: {female adult master:310786}   Assessment & Plan:   A:  1. ***  P:  1. ***

## 2015-05-15 NOTE — Progress Notes (Signed)
[place] 5+6wks IUP w/NO fht,crl .4cm,should be 9wks by previous ultrasound,two simple cysts rt ov  (#1)2.7 x 2.7 x 2.2cm,(#2) 2 x 1.9 x 2cm,normal lt ov,Pt is seeing Dr.Ferguson after ultrasound.

## 2015-05-15 NOTE — Progress Notes (Signed)
Patient ID: Kimberly Eaton, female   DOB: [birth year], 44 y.o.   MRN: [id number]  Fetal Surveillance Testing today:  Dating U/S   High Risk Pregnancy Diagnosis(es):   AMA  [id number] [redacted]w[redacted]d Estimated Date of Delivery: 12/18/15  Last menstrual period 03/04/2015.  Urinalysis: Not done   HPI: The patient is being seen today for ongoing management of AMA pregnancy [redacted]w[redacted]d. So far, her pregnancy has been complicated by bradycardia.  She states she has had 2 rounds of clomid, and had a successful pregnancy once. Patient states her last progesterone was high at 70.6, up from 9.3 before that.  She states she has an O+ blood type.  BP weight and urine results all reviewed and noted.  All questions were answered.  All lab and sonogram results have been reviewed. Comments: abnormal: dating U/S shows NO FHT  Assessment:  1.  Pregnancy at [redacted]w[redacted]d,  NO FHT      2. Missed Ab 9 wk  Medication Plan: Rx Cytotec, with 1 refill   Treatment Plan: F/u in 2 weeks for progesterone level check    This chart was SCRIBED for Christin Bach, MD by Ronney Lion, ED Scribe. This patient was seen in my office, and the patient's care was started at 2:00 PM.  I personally performed the services described in this documentation, which was SCRIBED in my presence. The recorded information has been reviewed and considered accurate. It has been edited as necessary during review. Tilda Burrow, MD .

## 2015-05-17 ENCOUNTER — Encounter: Payer: Medicaid Other | Admitting: Women's Health

## 2015-05-24 ENCOUNTER — Encounter: Payer: Medicaid Other | Admitting: Women's Health

## 2015-06-02 ENCOUNTER — Encounter: Payer: Self-pay | Admitting: Obstetrics and Gynecology

## 2015-06-02 ENCOUNTER — Ambulatory Visit (INDEPENDENT_AMBULATORY_CARE_PROVIDER_SITE_OTHER): Payer: Medicaid Other | Admitting: Obstetrics and Gynecology

## 2015-06-02 DIAGNOSIS — O039 Complete or unspecified spontaneous abortion without complication: Secondary | ICD-10-CM | POA: Diagnosis not present

## 2015-06-02 DIAGNOSIS — Z332 Encounter for elective termination of pregnancy: Secondary | ICD-10-CM

## 2015-06-02 NOTE — Progress Notes (Signed)
Patient ID: Kimberly Eaton, female   DOB: January 09, 1971, 44 y.o.   MRN: [id number]    [facility]  Patient name: Kimberly Eaton MRN [id number]  Date of birth: January 09, 1971  CC & HPI:  Kimberly Eaton is a 44 y.o. female presenting today for follow-up on a spontaneous miscarriage that was determined on 6/27. Pt was prescribed Cytotec after US showed no FHT which caused heavy bleeding. She does want to have children in the future. Pt denies any breast tenderness.  Pt reports increased stress at home over the last several weeks. She is currently in a custody dispute with her ex-husband, who currently has custody of her children, and is planning to get married in the fall. Pt has been crying daily for the last 3 weeks.  ROS:  A complete 10 system review of systems was obtained and all systems are negative except as noted in the HPI and PMH.   Pertinent History Reviewed:   Reviewed: Significant for miscarriage Medical         Past Medical History  Diagnosis Date  . Supervision of normal pregnancy in first trimester 12/24/2013  . AMA (advanced maternal age) multigravida 35+ 12/24/2013  . Miscarriage 01/07/2014  . Shoulder joint pain 02/01/2014    Referred to Dr Madelon Lipsaffrey  . Stomach cramps 11/28/2014  . Vaginal discharge 11/28/2014  . BV (bacterial vaginosis) 11/28/2014  . Pregnant 04/11/2015  . Miscarriage                               Surgical Hx:    Past Surgical History  Procedure Laterality Date  . Tonsillectomy and adenoidectomy    . Ankle surgery Left    Medications: Reviewed & Updated - see associated section                      No current outpatient prescriptions on file.   Social History: Reviewed -  reports that she has quit smoking. Her smoking use included Cigarettes. She has never used smokeless tobacco.  Objective Findings:  Vitals: Blood pressure 140/92, height 5\' 2"  (1.575 m), weight 172 lb (78.019 kg), last menstrual period 03/04/2015, not currently  breastfeeding.  Physical Examination: General appearance - alert, well appearing, and in no distress and oriented to person, place, and time Mental status - alert, oriented to person, place, and time, normal mood, behavior, speech, dress, motor activity, and thought processes  Edinburgh score of 15  Discussion only for 20 minutes regarding consultation with a fertility specialist, insurance coverage and lab work to assess ovulation  Assessment & Plan:   A:  1. Resolved spontaneous miscarriage 2. Pt with increased stress at home, but depression scale not indicative of chronic depression or acute SI/HI  P:  1. Will refer pt to fertility specialist 2. Will check progesterone 21 days after next normal period  patient to call when next menses begins to schedule serum progesterone  This chart was scribed for Tilda BurrowJohn Tyra Gural V, MD by Gwenyth Oberatherine Macek, Medical Scribe. This patient was seen in room 1 and the patient's care was started at 11:06 AM.   I personally performed the services described in this documentation, which was SCRIBED in my presence. The recorded information has been reviewed and considered accurate. It has been edited as necessary during review. Tilda BurrowFERGUSON,Oddie Kuhlmann V, MD

## 2015-06-02 NOTE — Progress Notes (Signed)
Patient ID: Kimberly BogaKristi M McLeod, female   DOB: 1971-06-28, 44 y.o.   MRN: [id number] Pt here today for follow up. Pt denies any problems or concerns, physically at this time. Pt states that emotionally she is stressed, with life in general, children and some big changes. Pt given depression scale to fill out.

## 2015-08-11 ENCOUNTER — Telehealth: Payer: Self-pay | Admitting: Obstetrics and Gynecology

## 2015-09-11 ENCOUNTER — Encounter: Payer: Self-pay | Admitting: Family Medicine

## 2015-09-11 ENCOUNTER — Ambulatory Visit (INDEPENDENT_AMBULATORY_CARE_PROVIDER_SITE_OTHER): Payer: 59 | Admitting: Family Medicine

## 2015-09-11 VITALS — BP 128/90 | Temp 98.4°F | Ht 62.0 in | Wt 169.5 lb

## 2015-09-11 DIAGNOSIS — R109 Unspecified abdominal pain: Secondary | ICD-10-CM | POA: Diagnosis not present

## 2015-09-11 MED ORDER — PANTOPRAZOLE SODIUM 40 MG PO TBEC: 40.0000 mg | DELAYED_RELEASE_TABLET | Freq: Every day | ORAL | Status: DC

## 2015-09-11 MED ORDER — HYOSCYAMINE SULFATE 0.125 MG SL SUBL: 0.1250 mg | SUBLINGUAL_TABLET | Freq: Four times a day (QID) | SUBLINGUAL | Status: DC | PRN

## 2015-09-11 NOTE — Progress Notes (Signed)
   Subjective:    Patient ID: Kimberly Eaton, female    DOB: 1971/10/23, 44 y.o.   MRN: [id number]  Abdominal Pain This is a new problem. The current episode started more than 1 year ago. The problem occurs intermittently. The problem has been gradually worsening. The pain is located in the generalized abdominal region. The pain is moderate. The quality of the pain is cramping. The abdominal pain does not radiate. Associated symptoms comments: fatigue. The pain is aggravated by eating. The pain is relieved by nothing. Treatments tried: diet changes. The treatment provided moderate relief.    Patient states that she has no other concerns at this time.   Hits when she eats red meat  Often hits thirty min or so after meals  Sometimes sooner  Sometimes pain lowr and somerimes higher  Bad pain and serious when it hits, last for four hours  Often nausea  Mo had g b out rel young  No hx of reflux  No smoking hx  More frequently, affecting activities  Dim energy  [place] a [place]er [place] Patient does not smoke Review of Systems  Gastrointestinal: Positive for abdominal pain.   No fever no chills no dysuria some increased frequency of bowels. Cramping at times also.    Objective:   Physical Exam Alert no acute distress Vitals stable. HEENT normal. Lungs clear. Heart rare rhythm. Abdomen no masses no rebound no guarding mild right lateral abdominal tenderness excellent bowel sounds      Assessment & Plan:  Impression postprandial severe colicky type pain somewhat atypical in location though this can definitely be gallbladder in etiology discussed other concerns reflux potentially IBS and of course less likely considerably would be something more serious discussed plan right upper quadrant ultrasound start Protonix symptomatic care discussed Levsin when necessary recheck in several weeks WSL

## 2015-09-15 ENCOUNTER — Ambulatory Visit (HOSPITAL_COMMUNITY)
Admission: RE | Admit: 2015-09-15 | Discharge: 2015-09-15 | Disposition: A | Discharge status: Home / Self Care | Payer: 59 | Source: Ambulatory Visit | Attending: Family Medicine | Admitting: Family Medicine

## 2015-09-15 DIAGNOSIS — K760 Fatty (change of) liver, not elsewhere classified: Secondary | ICD-10-CM | POA: Insufficient documentation

## 2015-09-15 DIAGNOSIS — R1011 Right upper quadrant pain: Secondary | ICD-10-CM | POA: Insufficient documentation

## 2015-09-18 ENCOUNTER — Other Ambulatory Visit: Payer: 59

## 2015-09-18 DIAGNOSIS — Z319 Encounter for procreative management, unspecified: Secondary | ICD-10-CM

## 2015-09-19 LAB — PROGESTERONE: Progesterone: 12 ng/mL

## 2015-10-02 ENCOUNTER — Ambulatory Visit: Payer: 59 | Admitting: Family Medicine

## 2015-10-03 ENCOUNTER — Encounter: Payer: Self-pay | Admitting: Obstetrics and Gynecology

## 2015-10-07 ENCOUNTER — Other Ambulatory Visit: Payer: Self-pay | Admitting: Family Medicine

## 2015-10-30 ENCOUNTER — Encounter: Payer: Self-pay | Admitting: Family Medicine

## 2015-10-30 ENCOUNTER — Ambulatory Visit (INDEPENDENT_AMBULATORY_CARE_PROVIDER_SITE_OTHER): Payer: 59 | Admitting: Family Medicine

## 2015-10-30 VITALS — BP 134/86 | Ht 62.0 in | Wt 171.0 lb

## 2015-10-30 DIAGNOSIS — R1084 Generalized abdominal pain: Secondary | ICD-10-CM | POA: Diagnosis not present

## 2015-10-30 DIAGNOSIS — E785 Hyperlipidemia, unspecified: Secondary | ICD-10-CM | POA: Diagnosis not present

## 2015-10-30 NOTE — Progress Notes (Signed)
   Subjective:    Patient ID: Kimberly Eaton, female    DOB: 06-15-1971, 44 y.o.   MRN: [id number]  HPI Patient arrives for a long discussion and involving her multiple concerns. Next  Reports ongoing pain after eating red meat. Medications have not helped much that regard. Patient arrives for a follow up on abdominal pain. Patient reports she is doing better as long as she doesn't eat red meat or pork.  Uses a left some when necessary. Seems to help the cramping a bit.  Ultrasound revealed normal gallbladder but definite fatty liver. Patient has had no blood work in the past. Not sure she has history of elevated liver enzymes.  Teaching continues to have ongoing cramping pain proceeding. Only [name]. Patient wonders if may be related to H. pylori. Maintain area of cramping low abdomen.  No rash no itching no pruritus post red meat chest GI symptoms but very predictable can occur with small and large amounts     Review of Systems No headache no chest pain no back pain positive abdominal pain no change in bowel habits ROS otherwise negative    Objective:   Physical Exam Alert vitals stable. HEENT normal lungs clear heart rare rhythm abdominal exam stable       Assessment & Plan:  Impression 1 abdominal pain somewhat vague. But does seem to recur with red meat consistently. Wonder if this could be a GI specific expression of meat allergy. This has been documented discussed with patient #2 reflux meds not helping much encouraged to stop #3 fatty liver discussed including potential long-term important complications plan diet exercise discussed appropriate blood work further recommendations based results 25 minutes spent most in discussion WSL

## 2015-10-31 ENCOUNTER — Other Ambulatory Visit: Payer: Self-pay | Admitting: *Deleted

## 2015-10-31 MED ORDER — PANTOPRAZOLE SODIUM 40 MG PO TBEC: DELAYED_RELEASE_TABLET | ORAL | Status: DC

## 2016-01-18 ENCOUNTER — Telehealth: Payer: Self-pay | Admitting: Obstetrics and Gynecology

## 2016-01-18 NOTE — Telephone Encounter (Signed)
Pt has sought REI consultation, been told they would not use her eggs due to advanced age, so pt is not seeking REI assistance at this time.

## 2016-03-20 ENCOUNTER — Telehealth: Payer: Self-pay | Admitting: Family Medicine

## 2016-03-20 DIAGNOSIS — W57XXXA Bitten or stung by nonvenomous insect and other nonvenomous arthropods, initial encounter: Secondary | ICD-10-CM

## 2016-03-20 NOTE — Telephone Encounter (Signed)
This b w was ordered nearly five mo ago, add lyme disease titers pr pt request, if pos will need to come in for disc because the b w is not always s reliable

## 2016-03-20 NOTE — Telephone Encounter (Signed)
Pt would like an order for labs to check of lyme disease to be added to current lab orders before she gets them done   Please advise & call pt when done

## 2016-03-21 NOTE — Telephone Encounter (Signed)
Spoke with patient and informed her per Dr.Steve Luking- We are adding the Lyme disease titers to blood work. Informed patient that previously ordered blood work is good only for 6 months. Patient verbalized understanding.

## 2016-04-24 LAB — B. BURGDORFI ANTIBODIES: Lyme IgG/IgM Ab: <0.91 {ISR} (ref 0.00–0.90)

## 2016-04-26 LAB — BASIC METABOLIC PANEL
BUN/Creatinine Ratio: 9 (ref 9–23)
BUN: 7 mg/dL (ref 6–24)
CO2: 20 mmol/L (ref 18–29)
CREATININE: 0.75 mg/dL (ref 0.57–1.00)
Calcium: 9.1 mg/dL (ref 8.7–10.2)
Chloride: 98 mmol/L (ref 96–106)
GFR calc non Af Amer: 97 mL/min/{1.73_m2} (ref >59)
GFR, EST AFRICAN AMERICAN: 111 mL/min/{1.73_m2} (ref >59)
Glucose: 78 mg/dL (ref 65–99)
Potassium: 4.7 mmol/L (ref 3.5–5.2)
Sodium: 135 mmol/L (ref 134–144)

## 2016-04-26 LAB — LIPID PANEL
CHOLESTEROL TOTAL: 184 mg/dL (ref 100–199)
Chol/HDL Ratio: 3.5 ratio units (ref 0.0–4.4)
HDL: 53 mg/dL (ref >39)
LDL CALC: 99 mg/dL (ref 0–99)
TRIGLYCERIDES: 158 mg/dL — AB (ref 0–149)
VLDL CHOLESTEROL CAL: 32 mg/dL (ref 5–40)

## 2016-04-26 LAB — HEPATIC FUNCTION PANEL
ALBUMIN: 3.9 g/dL (ref 3.5–5.5)
ALT: 14 IU/L (ref 0–32)
AST: 13 IU/L (ref 0–40)
Alkaline Phosphatase: 76 IU/L (ref 39–117)
BILIRUBIN TOTAL: 0.5 mg/dL (ref 0.0–1.2)
BILIRUBIN, DIRECT: 0.13 mg/dL (ref 0.00–0.40)
Total Protein: 6.2 g/dL (ref 6.0–8.5)

## 2016-04-26 LAB — ALPHA GAL IGE: ALPHA GAL IGE: 4.57 kU/L — AB (ref <0.35)

## 2016-06-06 ENCOUNTER — Telehealth: Payer: Self-pay | Admitting: Family Medicine

## 2016-06-06 NOTE — Telephone Encounter (Signed)
[facility].  She is scheduled for in the morning, but [facility] or [facility] notes [facility].  They need an order for OB Complete less than and genetic counseling.  They need this faxed ASAP to [phone].

## 2016-06-06 NOTE — Telephone Encounter (Signed)
[facility]. She has been seeing them for her fertility issues.

## 2016-06-07 ENCOUNTER — Other Ambulatory Visit (HOSPITAL_COMMUNITY): Payer: Self-pay | Admitting: Obstetrics and Gynecology

## 2016-06-07 ENCOUNTER — Encounter (HOSPITAL_COMMUNITY): Payer: Self-pay

## 2016-06-07 ENCOUNTER — Ambulatory Visit (HOSPITAL_COMMUNITY)
Admission: RE | Admit: 2016-06-07 | Discharge: 2016-06-07 | Disposition: A | Discharge status: Home / Self Care | Payer: BLUE CROSS/BLUE SHIELD | Source: Ambulatory Visit | Attending: Obstetrics and Gynecology | Admitting: Obstetrics and Gynecology

## 2016-06-07 ENCOUNTER — Ambulatory Visit (HOSPITAL_COMMUNITY)
Admission: RE | Admit: 2016-06-07 | Discharge: 2016-06-07 | Disposition: A | Discharge status: Home / Self Care | Payer: BLUE CROSS/BLUE SHIELD | Source: Ambulatory Visit | Attending: Family Medicine | Admitting: Family Medicine

## 2016-06-07 DIAGNOSIS — O35EXX0 Maternal care for other (suspected) fetal abnormality and damage, fetal genitourinary anomalies, not applicable or unspecified: Secondary | ICD-10-CM

## 2016-06-07 DIAGNOSIS — Z3A14 14 weeks gestation of pregnancy: Secondary | ICD-10-CM

## 2016-06-07 DIAGNOSIS — Z3689 Encounter for other specified antenatal screening: Secondary | ICD-10-CM

## 2016-06-07 DIAGNOSIS — Z36 Encounter for antenatal screening of mother: Secondary | ICD-10-CM | POA: Diagnosis not present

## 2016-06-07 DIAGNOSIS — O358XX0 Maternal care for other (suspected) fetal abnormality and damage, not applicable or unspecified: Secondary | ICD-10-CM

## 2016-06-07 DIAGNOSIS — O359XX0 Maternal care for (suspected) fetal abnormality and damage, unspecified, not applicable or unspecified: Secondary | ICD-10-CM | POA: Insufficient documentation

## 2016-06-07 DIAGNOSIS — O09529 Supervision of elderly multigravida, unspecified trimester: Secondary | ICD-10-CM

## 2016-06-10 ENCOUNTER — Other Ambulatory Visit (HOSPITAL_COMMUNITY): Payer: Self-pay

## 2016-06-10 ENCOUNTER — Encounter (HOSPITAL_COMMUNITY): Payer: Self-pay

## 2016-06-11 NOTE — Progress Notes (Signed)
Genetic Counseling  High-Risk Gestation Note  Appointment Date:  06/07/2016 Referred By: Candice Camp, MD Date of Birth:  [birth year] Partner: Ivin Booty   Pregnancy History: [id number] Estimated Date of Delivery: 12/01/16 Estimated Gestational Age: [redacted]w[redacted]d Attending: Particia Nearing, MD  Kimberly Eaton and her husband, Kimberly Eaton, were seen for genetic counseling because of a maternal age of 45 y.o. and enlarged fetal bladder on ultrasound.     In summary:  Discussed AMA and associated risk for fetal aneuploidy  Reviewed ultrasound findings from today: enlarged fetal bladder extended into enlarged urachus  Suspected bladder outlet obstruction  Guarded prognosis  Follow-up ultrasound scheduled 06/21/16 to reassess amniotic fluid volume and fetal anatomy  Discussed options for screening  NIPS-previously performed at [facility] with no results and low fetal fraction; re-drawn at [facility]  Discussed various causes for low fetal fraction including underlying fetal aneuploidy, maternal physiology, early gestation  Discussed diagnostic testing options  Amniocentesis-declined  Reviewed family history concerns  Discussed carrier screening options (CF, SMA, Hemoglobinopathies)- patient declined  They were counseled regarding maternal age and the association with risk for chromosome conditions due to nondisjunction with aging of the ova.   We reviewed chromosomes, nondisjunction, and the associated 1 in 6 risk for fetal aneuploidy related to a maternal age of 45 y.o. at [redacted]w[redacted]d gestation.  They were counseled that the risk for aneuploidy decreases as gestational age increases, accounting for those pregnancies which spontaneously abort.  We specifically discussed Down syndrome (trisomy 54), trisomies 79 and 76, and sex chromosome aneuploidies (47,XXX and 47,XXY) including the common features and prognoses of each.   We reviewed available screening options including  noninvasive prenatal  screening (NIPS)/cell free DNA (cfDNA) screening and detailed ultrasound.  They were counseled that screening tests are used to modify a patient's a priori risk for aneuploidy, typically based on age. This estimate provides a pregnancy specific risk assessment. We reviewed the benefits and limitations of each option. Specifically, we discussed the conditions for which each test screens, the detection rates, and false positive rates of each. Kimberly Eaton had NIPS drawn through her OB office, and low fetal fraction was detected, yielding no results. A redraw was performed and status is currently pending. We reviewed the methodology of NIPS and discussed differential diagnoses for low fetal fraction including obesity, physiological differences in maternal blood, and underlying fetal aneuploidy. Additionally, we discussed that there may be additional factors that impact fetal fraction in pregnancy that are not yet reported in the medical literature. We discussed that if no result is able to be determined from the second sample, a third attempt at NIPS is currently not recommended.   They were also counseled regarding diagnostic testing via amniocentesis. We reviewed the approximate 1 in 300-500 risk for complications from amniocentesis, including spontaneous pregnancy loss. We discussed the possible results that the tests might provide including: positive, negative, unanticipated, and no result. Finally, they were counseled regarding the cost of each option and potential out of pocket expenses. After consideration of all the options, they declined amniocentesis.   Ultrasound performed today visualized enlarged fetal bladder extended into enlarged urachus and into the cord.  No ureters identified; renal pelves prominent and parenchyma mildly echogenic. No other gross abnormalities identified. Stomach not fluid-filled. Normal amniotic fluid volume visualized today. Complete ultrasound results reported separately.  The results are most suggestive of a bladder outlet obstruction.   Fetal bladder outlet obstruction, or lower urinary tract obstruction (LUTO) describes a blockage of fetal  urination along the urethra, which leads to an enlarged bladder. In males, this is most often caused by posterior urethral valves, and in females, most often caused by urethral atresia; however, there are various causes for the obstruction. The effects can range from mild to severe, and the major component that determines perinatal outcome is the secondary complications of the obstruction. Lower urinary tract obstruction can lead to problems with the kidneys including hydronephrosis and renal dysplasia. Subsequently, oligohydramnios may develop in pregnancy, which can lead to pulmonary hypoplasia.  It was discussed with the couple the overall prognosis is grave but that the prognosis is somewhat dependent upon amniotic fluid volume in pregnancy and subsequent kidney function. Follow-up ultrasounds are scheduled in the near future to reassess fetal anatomy and amniotic fluid volume. The couple indicated that they plan to continue the pregnancy and they declined invasive testing at this time.   Kimberly Eaton was provided with written information regarding cystic fibrosis (CF), spinal muscular atrophy (SMA) and hemoglobinopathies including the carrier frequency, availability of carrier screening and prenatal diagnosis if indicated.  In addition, we discussed that CF and hemoglobinopathies are routinely screened for as part of the  newborn screening panel.  After further discussion, she declined screening for CF, SMA and hemoglobinopathies.  The couple reported no birth defects, intellectual disability, and known genetic conditions in the family histories. Detailed pedigree Holiday representative and analysis was declined today due to the nature and length of today's overall visit for the couple. Without further information regarding the  provided family history, an accurate genetic risk cannot be calculated. Further genetic counseling is warranted if more information is obtained.  Kimberly Eaton denied exposure to environmental toxins or chemical agents. She denied the use of alcohol, tobacco or street drugs. She denied significant viral illnesses during the course of her pregnancy. Her medical and surgical histories were noncontributory.   I counseled this couple regarding the above risks and available options. Most of the counseling was provided by Janit Pagan, [employer] genetic counseling student, under my direct supervision. The approximate face-to-face time with the genetic counselor was 35 minutes.  Quinn Plowman, MS,  Patent attorney

## 2016-06-21 ENCOUNTER — Encounter (HOSPITAL_COMMUNITY): Payer: Self-pay

## 2016-06-21 ENCOUNTER — Ambulatory Visit (HOSPITAL_COMMUNITY)
Admission: RE | Admit: 2016-06-21 | Discharge: 2016-06-21 | Disposition: A | Discharge status: Home / Self Care | Payer: BLUE CROSS/BLUE SHIELD | Source: Ambulatory Visit | Attending: Obstetrics and Gynecology | Admitting: Obstetrics and Gynecology

## 2016-06-21 DIAGNOSIS — O35EXX0 Maternal care for other (suspected) fetal abnormality and damage, fetal genitourinary anomalies, not applicable or unspecified: Secondary | ICD-10-CM

## 2016-06-21 DIAGNOSIS — O358XX0 Maternal care for other (suspected) fetal abnormality and damage, not applicable or unspecified: Secondary | ICD-10-CM | POA: Diagnosis present

## 2016-06-21 DIAGNOSIS — Z3A16 16 weeks gestation of pregnancy: Secondary | ICD-10-CM | POA: Diagnosis not present

## 2016-07-04 ENCOUNTER — Other Ambulatory Visit (HOSPITAL_COMMUNITY): Payer: Self-pay | Admitting: Maternal and Fetal Medicine

## 2016-07-04 ENCOUNTER — Ambulatory Visit (HOSPITAL_COMMUNITY)
Admission: RE | Admit: 2016-07-04 | Discharge: 2016-07-04 | Disposition: A | Discharge status: Home / Self Care | Payer: BLUE CROSS/BLUE SHIELD | Source: Ambulatory Visit | Attending: Obstetrics and Gynecology | Admitting: Obstetrics and Gynecology

## 2016-07-04 ENCOUNTER — Encounter (HOSPITAL_COMMUNITY): Payer: Self-pay

## 2016-07-04 DIAGNOSIS — O358XX0 Maternal care for other (suspected) fetal abnormality and damage, not applicable or unspecified: Secondary | ICD-10-CM | POA: Insufficient documentation

## 2016-07-04 DIAGNOSIS — O09522 Supervision of elderly multigravida, second trimester: Secondary | ICD-10-CM

## 2016-07-04 DIAGNOSIS — Z3A18 18 weeks gestation of pregnancy: Secondary | ICD-10-CM | POA: Diagnosis not present

## 2016-07-04 DIAGNOSIS — O35EXX0 Maternal care for other (suspected) fetal abnormality and damage, fetal genitourinary anomalies, not applicable or unspecified: Secondary | ICD-10-CM

## 2016-07-05 ENCOUNTER — Ambulatory Visit (HOSPITAL_COMMUNITY): Payer: 59

## 2016-07-19 ENCOUNTER — Other Ambulatory Visit (HOSPITAL_COMMUNITY): Payer: Self-pay | Admitting: *Deleted

## 2016-07-19 DIAGNOSIS — O359XX0 Maternal care for (suspected) fetal abnormality and damage, unspecified, not applicable or unspecified: Secondary | ICD-10-CM

## 2016-08-01 ENCOUNTER — Encounter (HOSPITAL_COMMUNITY): Payer: Self-pay

## 2016-08-01 ENCOUNTER — Ambulatory Visit (HOSPITAL_COMMUNITY)
Admission: RE | Admit: 2016-08-01 | Discharge: 2016-08-01 | Disposition: A | Discharge status: Home / Self Care | Payer: BLUE CROSS/BLUE SHIELD | Source: Ambulatory Visit | Attending: Obstetrics and Gynecology | Admitting: Obstetrics and Gynecology

## 2016-08-01 ENCOUNTER — Other Ambulatory Visit (HOSPITAL_COMMUNITY): Payer: Self-pay | Admitting: Maternal and Fetal Medicine

## 2016-08-01 VITALS — BP 125/77 | HR 82 | Wt 184.4 lb

## 2016-08-01 DIAGNOSIS — O09522 Supervision of elderly multigravida, second trimester: Secondary | ICD-10-CM

## 2016-08-01 DIAGNOSIS — O09529 Supervision of elderly multigravida, unspecified trimester: Secondary | ICD-10-CM

## 2016-08-01 DIAGNOSIS — O359XX0 Maternal care for (suspected) fetal abnormality and damage, unspecified, not applicable or unspecified: Secondary | ICD-10-CM

## 2016-08-01 DIAGNOSIS — O358XX0 Maternal care for other (suspected) fetal abnormality and damage, not applicable or unspecified: Secondary | ICD-10-CM | POA: Insufficient documentation

## 2016-08-01 DIAGNOSIS — Z3A22 22 weeks gestation of pregnancy: Secondary | ICD-10-CM

## 2016-08-29 ENCOUNTER — Encounter (HOSPITAL_COMMUNITY): Payer: Self-pay

## 2016-08-29 ENCOUNTER — Ambulatory Visit (HOSPITAL_COMMUNITY): Payer: 59

## 2016-12-26 ENCOUNTER — Telehealth: Payer: Self-pay | Admitting: Family Medicine

## 2016-12-26 NOTE — Telephone Encounter (Signed)
Patient called stating that she was having an allergic reaction to an unknown substance. Patient states that symptoms started on yesterday but she woke up with Facial swelling, and hives. Discussed patient's symptoms with Dr.Steve Luking and was told to advise patient to go to the Emergency room for possible angioedema that could go bad quickly. Informed patient of Dr.Steve's recommendation. Patient verbalized understanding and stated that she is not going to the Emergency Room because she needed relief only from the itching. Advised patient ER is still recommended and patient hung up phone.

## 2017-04-12 ENCOUNTER — Encounter (HOSPITAL_COMMUNITY): Payer: Self-pay

## 2018-06-10 ENCOUNTER — Encounter: Payer: Self-pay | Admitting: Family Medicine

## 2018-06-10 ENCOUNTER — Ambulatory Visit (INDEPENDENT_AMBULATORY_CARE_PROVIDER_SITE_OTHER): Payer: Managed Care, Other (non HMO) | Admitting: Family Medicine

## 2018-06-10 VITALS — BP 156/94 | Ht 62.0 in | Wt 168.6 lb

## 2018-06-10 DIAGNOSIS — M79602 Pain in left arm: Secondary | ICD-10-CM | POA: Diagnosis not present

## 2018-06-10 DIAGNOSIS — Z79899 Other long term (current) drug therapy: Secondary | ICD-10-CM | POA: Diagnosis not present

## 2018-06-10 DIAGNOSIS — Z1329 Encounter for screening for other suspected endocrine disorder: Secondary | ICD-10-CM | POA: Diagnosis not present

## 2018-06-10 DIAGNOSIS — R030 Elevated blood-pressure reading, without diagnosis of hypertension: Secondary | ICD-10-CM

## 2018-06-10 DIAGNOSIS — T782XXD Anaphylactic shock, unspecified, subsequent encounter: Secondary | ICD-10-CM

## 2018-06-10 DIAGNOSIS — Z91018 Allergy to other foods: Secondary | ICD-10-CM

## 2018-06-10 DIAGNOSIS — R5383 Other fatigue: Secondary | ICD-10-CM | POA: Diagnosis not present

## 2018-06-10 DIAGNOSIS — L500 Allergic urticaria: Secondary | ICD-10-CM

## 2018-06-10 NOTE — Progress Notes (Signed)
   Subjective:    Patient ID: Kimberly SledgeKristi M Esco, female    DOB: 10-Oct-1971, 47 y.o.   MRN: [id number]  HPI Pt here for follow up from ER. [facility] in [place] on June 06 2018. Pt had allergic reaction. Pt states she ate an eggroll and did not realize it had meat in it.    Pt has hx of trouble with dairy and cheese and no red meat  Eats a lot of fish and chicken   Sat night got an egg roll   Developed substantial itching and swelling  Went to the ER , had sub sob, got benanadryl and epinephrine   Got a rx for the epi pen    Mo has hx of chron's disease, on remicade, g mo has hx of colon cncr   Pt has intermittent issues with abd discomfort, pain at times, substantial frequent abd pain triggered by eating out   Around a month ago hand started tingling.  Also concerned because of the fingertips are often very sensitive.  Also concerned because sometimes the fingertips get red or dark along with painful and sensitive.  No frank ulcerations.  Does not really report a trigger with cold     Review of Systems No headache, no major weight loss or weight gain, no chest pain no back pain abdominal pain no change in bowel habits complete ROS otherwise negative     Objective:   Physical Exam  Alert and oriented, vitals reviewed and stable, NAD ENT-TM's and ext canals WNL bilat via otoscopic exam Soft palate, tonsils and post pharynx WNL via oropharyngeal exam Neck-symmetric, no masses; thyroid nonpalpable and nontender Pulmonary-no tachypnea or accessory muscle use; Clear without wheezes via auscultation Card--no abnrml murmurs, rhythm reg and rate WNL Carotid pulses symmetric, without bruits Patient abdominal exam no discrete tenderness.  Excellent bowel sounds no organomegaly Left hand distal fingertips somewhat erythematous distal middle finger and distal fourth finger.  Grip intact.  Sensation intact.  Pulses radial and ulnar appear intact    Assessment & Plan:    Impression 1 ongoing red meat allergy.  Recent challenges with anaphylactic syndrome resulting in substantial hives and shortness of breath.  Patient now has EpiPen.  2.  Chronic symptoms of loose stools cramping usually worse after spicy meals.  Has had for many years.  Still has these spells.  Now more concerned since mother has been diagnosed with Crohn's disease  3.  Elevated blood pressure.  Discussed.  4.  Vascular symptoms left hand.  May be completely benign.  However with smoking history and distal acral symptomatology feel a vascular referral is worthwhile to assess for something uncommon such as Buerger's disease discussed with patient  Greater than 50% of this 40 minute face to face visit was spent in counseling and discussion and coordination of care regarding the above diagnosis/diagnosies  Appropriate blood work further recommendations based on results.  Follow-up in a couple months.

## 2018-06-15 ENCOUNTER — Encounter: Payer: Self-pay | Admitting: Family Medicine

## 2018-06-18 ENCOUNTER — Other Ambulatory Visit: Payer: Self-pay

## 2018-06-18 DIAGNOSIS — M79602 Pain in left arm: Secondary | ICD-10-CM

## 2018-06-19 ENCOUNTER — Other Ambulatory Visit: Payer: Self-pay

## 2018-06-19 DIAGNOSIS — M79602 Pain in left arm: Secondary | ICD-10-CM

## 2018-06-29 ENCOUNTER — Encounter: Payer: Self-pay | Admitting: Vascular Surgery

## 2018-06-29 ENCOUNTER — Ambulatory Visit (HOSPITAL_COMMUNITY)
Admission: RE | Admit: 2018-06-29 | Discharge: 2018-06-29 | Disposition: A | Discharge status: Home / Self Care | Payer: Managed Care, Other (non HMO) | Source: Ambulatory Visit | Attending: Vascular Surgery | Admitting: Vascular Surgery

## 2018-06-29 ENCOUNTER — Ambulatory Visit (INDEPENDENT_AMBULATORY_CARE_PROVIDER_SITE_OTHER): Payer: Managed Care, Other (non HMO) | Admitting: Vascular Surgery

## 2018-06-29 VITALS — BP 165/111 | HR 79 | Temp 98.4°F | Resp 18 | Wt 156.0 lb

## 2018-06-29 DIAGNOSIS — M79602 Pain in left arm: Secondary | ICD-10-CM

## 2018-06-29 NOTE — Progress Notes (Signed)
kj                                    [facility] [place]  Patient name: Kimberly Eaton Peters MRN: [id number] DOB: 1971/10/04 Sex: female   Seen today in our [place] office  REASON FOR CONSULT: Pain and cyanosis intermittent and left third and fifth finger  HPI: Kimberly Eaton Dornbush is a 47 y.o. female, who is here today for evaluation.  She has had 3 separate episodes over the last several months where she had pain and discoloration in the fingers of her left hand.  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  She reports that her third and fifth fingers were cyanotic and painful.  She also reports that she had begun vaping around the time of the first episode.  She has stopped this since the most recent episode and reports no further issues similar to this.  She has no tissue loss.  She has no history of stroke.  She has no history of right arm symptoms and no history of ray nods type symptoms.  No cardiac issues.  Does have a long history of 20 years of cigarette smoking.  Past Medical History:  Diagnosis Date  . AMA (advanced maternal age) multigravida 35+ 12/24/2013  . BV (bacterial vaginosis) 11/28/2014  . Miscarriage 01/07/2014  . Miscarriage   . Pregnant 04/11/2015  . Shoulder joint pain 02/01/2014   Referred to Dr Madelon Lipsaffrey  . Stomach cramps 11/28/2014  . Supervision of normal pregnancy in first trimester 12/24/2013  . Vaginal discharge 11/28/2014    Family History  Problem Relation Age of Onset  . Diabetes Maternal Uncle   . Cancer Maternal Grandmother   . Cancer Maternal Grandfather        skin  . Stroke Maternal Uncle     SOCIAL HISTORY: Social History   Socioeconomic History  . Marital status: Legally Separated    Spouse name: Not on file  . Number of children: Not on file  . Years of education: Not on file  . Highest education level: Not on file  Occupational History  . Not on file    Social Needs  . Financial resource strain: Not on file  . Food insecurity:    Worry: Not on file    Inability: Not on file  . Transportation needs:    Medical: Not on file    Non-medical: Not on file  Tobacco Use  . Smoking status: Current Every Day Smoker    Packs/day: 0.50    Types: Cigarettes  . Smokeless tobacco: Never Used  Substance and Sexual Activity  . Alcohol use: Yes    Comment: occ; not now  . Drug use: No  . Sexual activity: Not Currently    Birth control/protection: None  Lifestyle  . Physical activity:    Days per week: Not on file    Minutes per session: Not on file  . Stress: Not on file  Relationships  . Social connections:    Talks on phone: Not on file    Gets together: Not on file    Attends religious service: Not on file    Active member of club or organization: Not on file    Attends meetings of clubs or organizations: Not on file    Relationship status: Not  on file  . Intimate partner violence:    Fear of current or ex partner: Not on file    Emotionally abused: Not on file    Physically abused: Not on file    Forced sexual activity: Not on file  Other Topics Concern  . Not on file  Social History Narrative  . Not on file    Allergies  Allergen Reactions  . Latex     Current Outpatient Medications  Medication Sig Dispense Refill  . aspirin 81 MG tablet Take 81 mg by mouth daily.    . hyoscyamine (LEVSIN/SL) 0.125 MG SL tablet Place 1 tablet (0.125 mg total) under the tongue every 6 (six) hours as needed. (Patient not taking: Reported on 06/29/2018) 30 tablet 0  . pantoprazole (PROTONIX) 40 MG tablet TAKE 1 TABLET (40 MG TOTAL) BY MOUTH DAILY. (Patient not taking: Reported on 06/29/2018) 90 tablet 0  . Prenatal Vit-Fe Fumarate-FA (PRENATAL VITAMIN PO) Take by mouth.    . ranitidine (ZANTAC) 150 MG tablet Take 150 mg by mouth daily.  0   No current facility-administered medications for this visit.     REVIEW OF SYSTEMS:  [X]  denotes  positive finding, [ ]  denotes negative finding Cardiac  Comments:  Chest pain or chest pressure:    Shortness of breath upon exertion:    Short of breath when lying flat:    Irregular heart rhythm:        Vascular    Pain in calf, thigh, or hip brought on by ambulation:    Pain in feet at night that wakes you up from your sleep:     Blood clot in your veins:    Leg swelling:         Pulmonary    Oxygen at home:    Productive cough:     Wheezing:         Neurologic    Sudden weakness in arms or legs:     Sudden numbness in arms or legs:     Sudden onset of difficulty speaking or slurred speech:    Temporary loss of vision in one eye:     Problems with dizziness:         Gastrointestinal    Blood in stool:     Vomited blood:         Genitourinary    Burning when urinating:     Blood in urine:        Psychiatric    Major depression:         Hematologic    Bleeding problems:    Problems with blood clotting too easily:        Skin    Rashes or ulcers:        Constitutional    Fever or chills:      PHYSICAL EXAM: Vitals:   06/29/18 0926 06/29/18 0928  BP: (!) 154/94 (!) 165/111  Pulse: 98 79  Resp: 18   Temp: 98.4 F (36.9 C)   TempSrc: Temporal   Weight: 156 lb (70.8 kg)     GENERAL: The patient is a well-nourished female, in no acute distress. The vital signs are documented above. CARDIOVASCULAR: 2+Plus radial and 2+ ulnar pulses bilaterally which are equal.  Carotid and subclavian arteries without bruits PULMONARY: There is good air exchange  ABDOMEN: Soft and non-tender  MUSCULOSKELETAL: There are no major deformities or cyanosis. NEUROLOGIC: No focal weakness or paresthesias are detected. SKIN: There are no ulcers or rashes  noted. PSYCHIATRIC: The patient has a normal affect.  DATA:  [facility] are unremarkable with normal pressures and waveforms and equal pressures bilaterally  MEDICAL  ISSUES: Had a long discussion with the patient regarding the symptoms.  It does not sound classic for either spasm or embolus.  It completely resolved between episodes so I feel that embolic disease is very unlikely.  She does not have any history of cardiac arrhythmias.  This was associated in a timeframe of her vaping.  Stressed to her the critical importance of avoidance of this and cigarette smoking.  I explained the next step for evaluation would be CT angiogram but would reserve this for new symptoms.  She will notify us should this occur otherwise we will see her again on an as-needed basis   Larina Earthlyodd F. Kayden Hutmacher, MD [facility] Tel [phone] Pager [phone]

## 2018-09-25 ENCOUNTER — Other Ambulatory Visit: Payer: Self-pay

## 2018-09-25 ENCOUNTER — Encounter (HOSPITAL_COMMUNITY): Payer: Self-pay

## 2018-09-25 ENCOUNTER — Emergency Department (HOSPITAL_COMMUNITY)
Admission: EM | Admit: 2018-09-25 | Discharge: 2018-09-25 | Disposition: A | Discharge status: Home / Self Care | Payer: Managed Care, Other (non HMO) | Attending: Emergency Medicine | Admitting: Emergency Medicine

## 2018-09-25 DIAGNOSIS — T781XXA Other adverse food reactions, not elsewhere classified, initial encounter: Secondary | ICD-10-CM | POA: Diagnosis not present

## 2018-09-25 DIAGNOSIS — R21 Rash and other nonspecific skin eruption: Secondary | ICD-10-CM | POA: Insufficient documentation

## 2018-09-25 DIAGNOSIS — R0602 Shortness of breath: Secondary | ICD-10-CM | POA: Insufficient documentation

## 2018-09-25 DIAGNOSIS — F17210 Nicotine dependence, cigarettes, uncomplicated: Secondary | ICD-10-CM | POA: Insufficient documentation

## 2018-09-25 HISTORY — DX: Other disorders of plasma-protein metabolism, not elsewhere classified: E88.09

## 2018-09-25 HISTORY — DX: Lipid storage disorder, unspecified: E75.6

## 2018-09-25 MED ORDER — PREDNISONE 20 MG PO TABS: 40.0000 mg | ORAL_TABLET | Freq: Every day | ORAL | 0 refills | Status: DC

## 2018-09-25 MED ORDER — FAMOTIDINE IN NACL 20-0.9 MG/50ML-% IV SOLN
20.0000 mg | Freq: Once | INTRAVENOUS | Status: AC
  Administered 2018-09-25: 20 mg via INTRAVENOUS
  Filled 2018-09-25: qty 50

## 2018-09-25 MED ORDER — METHYLPREDNISOLONE SODIUM SUCC 125 MG IJ SOLR
125.0000 mg | Freq: Once | INTRAMUSCULAR | Status: AC
  Administered 2018-09-25: 125 mg via INTRAVENOUS
  Filled 2018-09-25: qty 2

## 2018-09-25 MED ORDER — DIPHENHYDRAMINE HCL 50 MG/ML IJ SOLN
12.5000 mg | Freq: Once | INTRAMUSCULAR | Status: AC
Start: 2018-09-25 — End: 2018-09-25
  Administered 2018-09-25: 12.5 mg via INTRAVENOUS
  Filled 2018-09-25: qty 1

## 2018-09-25 MED ORDER — EPINEPHRINE 0.3 MG/0.3ML IJ SOAJ: 0.3000 mg | Freq: Once | INTRAMUSCULAR | 0 refills | Status: AC

## 2018-09-25 NOTE — Discharge Instructions (Addendum)
Start the prednisone prescription tomorrow.  Take over-the-counter Benadryl 1 capsule every 4-6 hours for 3 to 4 days.  Follow-up with your primary provider for recheck, return to the ER for any worsening symptoms.  Be sure to keep an EpiPen with you at all times.

## 2018-09-25 NOTE — ED Provider Notes (Signed)
[facility] EMERGENCY DEPARTMENT Provider Note   CSN: [id number] Arrival date & time: 09/25/18  0856     History   Chief Complaint Chief Complaint  Patient presents with  . Allergic Reaction    HPI Kimberly Eaton is a 47 y.o. female.  HPI  Kimberly Eaton is a 47 y.o. 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  She states around 630 she ate a biscuit from a restaurant that she thought she ordered chicken, but she believes that she may have been fed pork.  She noticed shortly after eating the biscuit that she began having difficulty breathing with abdominal cramping and loose stools.  Around 8:00 she used her EpiPen and took one Claritin tablet.  She states that her breathing improved after that but she continues to have redness to her face and torso along with severe itching.  She denies vomiting, chest pain, swelling of her face, lips, or tongue and difficulty swallowing.   Past Medical History:  Diagnosis Date  . Alpha galactosidase deficiency   . AMA (advanced maternal age) multigravida 35+ 12/24/2013  . BV (bacterial vaginosis) 11/28/2014  . Miscarriage 01/07/2014  . Miscarriage   . Pregnant 04/11/2015  . Shoulder joint pain 02/01/2014   Referred to Dr Madelon Lips  . Stomach cramps 11/28/2014  . Supervision of normal pregnancy in first trimester 12/24/2013  . Vaginal discharge 11/28/2014    Patient Active Problem List   Diagnosis Date Noted  . Pregnant 04/11/2015  . Stomach cramps 11/28/2014  . Vaginal discharge 11/28/2014  . BV (bacterial vaginosis) 11/28/2014  . Shoulder joint pain 02/01/2014  . Miscarriage 01/07/2014  . AMA (advanced maternal age) multigravida 35+ 12/24/2013    Past Surgical History:  Procedure Laterality Date  . ANKLE SURGERY Left   . TONSILLECTOMY AND ADENOIDECTOMY       OB History    Gravida  5   Para  2   Term  2   Preterm      AB  2   Living  2     SAB  2   TAB      Ectopic      Multiple      Live Births  2            Home Medications    Prior to Admission medications   Medication Sig Start Date End Date Taking? Authorizing Provider  hyoscyamine (LEVSIN/SL) 0.125 MG SL tablet Place 1 tablet (0.125 mg total) under the tongue every 6 (six) hours as needed. Patient not taking: Reported on 06/29/2018 09/11/15   Merlyn Albert, MD  pantoprazole (PROTONIX) 40 MG tablet TAKE 1 TABLET (40 MG TOTAL) BY MOUTH DAILY. Patient not taking: Reported on 06/29/2018 10/31/15   Merlyn Albert, MD    Family History Family History  Problem Relation Age of Onset  . Diabetes Maternal Uncle   . Cancer Maternal Grandmother   . Cancer Maternal Grandfather        skin  . Stroke Maternal Uncle     Social History Social History   Tobacco Use  . Smoking status: Current Every Day Smoker    Packs/day: 0.50    Types: Cigarettes  . Smokeless tobacco: Never Used  Substance Use Topics  . Alcohol use: Yes    Comment: occ; not now  . Drug use: No  Allergies   Latex   Review of Systems Review of Systems  Constitutional: Negative for activity change, appetite change, chills and fever.  HENT: Negative for facial swelling, sore throat and trouble swallowing.   Respiratory: Positive for shortness of breath. Negative for chest tightness and wheezing.   Cardiovascular: Negative for chest pain.  Gastrointestinal: Positive for diarrhea and nausea. Negative for abdominal pain and vomiting.  Genitourinary: Negative for dysuria.  Musculoskeletal: Negative for neck pain and neck stiffness.  Skin: Positive for color change and rash. Negative for wound.  Neurological: Negative for dizziness, syncope, weakness, numbness and headaches.     Physical Exam Updated Vital Signs BP (!) 161/89 (BP Location: Left Arm)   Pulse 92   Temp 97.7 F (36.5 C) (Oral)    Resp 16   Wt 70.8 kg   LMP 09/22/2018   SpO2 99%   BMI 28.55 kg/m   Physical Exam  Constitutional: She is oriented to person, place, and time. She appears well-developed and well-nourished. No distress.  Mildly anxious appearing  HENT:  Head: Normocephalic and atraumatic.  Mouth/Throat: Uvula is midline, oropharynx is clear and moist and mucous membranes are normal. No oral lesions. No uvula swelling.  Airway patent.  No edema of the face lips or tongue.  Uvula is midline and also nonedematous.  Neck: Normal range of motion. Neck supple.  Cardiovascular: Normal rate, regular rhythm and intact distal pulses.  No murmur heard. Pulmonary/Chest: Effort normal and breath sounds normal. No stridor. No respiratory distress. She has no wheezes.  Abdominal: Soft. She exhibits no distension and no mass. There is no tenderness. There is no guarding.  Musculoskeletal: She exhibits no edema or tenderness.  Lymphadenopathy:    She has no cervical adenopathy.  Neurological: She is alert and oriented to person, place, and time. No sensory deficit.  Skin: Skin is warm. Capillary refill takes less than 2 seconds. No rash noted. There is erythema.  Diffuse erythema of the face, upper extremities, and torso.  No rash or welts.  No edema  Psychiatric: She has a normal mood and affect.  Nursing note and vitals reviewed.    ED Treatments / Results  Labs (all labs ordered are listed, but only abnormal results are displayed) Labs Reviewed - No data to display  EKG None  Radiology No results found.  Procedures Procedures (including critical care time)  Medications Ordered in ED Medications  diphenhydrAMINE (BENADRYL) injection 12.5 mg (12.5 mg Intravenous Given 09/25/18 0933)  methylPREDNISolone sodium succinate (SOLU-MEDROL) 125 mg/2 mL injection 125 mg (125 mg Intravenous Given 09/25/18 0933)  famotidine (PEPCID) IVPB 20 mg premix (20 mg Intravenous New Bag/Given 09/25/18 0934)     Initial  Impression / Assessment and Plan / ED Course  I have reviewed the triage vital signs and the nursing notes.  Pertinent labs & imaging results that were available during my care of the patient were reviewed by me and considered in my medical decision making (see chart for details).     Pt with hx of alfa gal, food allergy after eating red meat.  Self treated with eip pen prior to ER arrival.    Pt feeling better after medications, she has been observed here w/o complication.  No respiratory distress.  She is requesting d/c home.  Agrees to tx plan and close PCP f/u if needed. Return precautions discussed.   Final Clinical Impressions(s) / ED Diagnoses   Final diagnoses:  Allergic reaction to food, initial encounter  ED Discharge Orders    None       Pauline Aus, Cordelia Poche 09/27/18 2132    Terrilee Files, MD 09/28/18 [phone]

## 2018-09-25 NOTE — ED Triage Notes (Signed)
Pt reports she has alphagal and went out for breakfast this morning and ate chicken biscuit . Thinks possible it may not have been.  States broke out in rash, itching and felt SOB. Used epi pen at [employer]. Pt feels as if symptoms are returning. Also took 2 clartin

## 2019-12-22 ENCOUNTER — Encounter: Payer: Self-pay | Admitting: Family Medicine

## 2020-02-18 ENCOUNTER — Ambulatory Visit (INDEPENDENT_AMBULATORY_CARE_PROVIDER_SITE_OTHER): Payer: 59 | Admitting: Family Medicine

## 2020-02-18 ENCOUNTER — Other Ambulatory Visit: Payer: Self-pay

## 2020-02-18 VITALS — Ht 62.0 in | Wt 175.0 lb

## 2020-02-18 DIAGNOSIS — R5383 Other fatigue: Secondary | ICD-10-CM

## 2020-02-18 DIAGNOSIS — Z13220 Encounter for screening for lipoid disorders: Secondary | ICD-10-CM | POA: Diagnosis not present

## 2020-02-18 DIAGNOSIS — I10 Essential (primary) hypertension: Secondary | ICD-10-CM | POA: Diagnosis not present

## 2020-02-18 MED ORDER — AMLODIPINE BESYLATE 5 MG PO TABS: ORAL_TABLET | ORAL | 1 refills | Status: AC

## 2020-02-18 NOTE — Progress Notes (Signed)
   Subjective:  Audio only  Patient ID: Kimberly Eaton, female    DOB: [birth year], 49 y.o.   MRN: [id number]  Hypertension This is a new problem. Past treatments include nothing. Compliance problems include exercise.   pt states bp has been around 160/85 for about one year. States it was high at her eye dr exam. Has been feeling sluggish, heart palpitations, has gained weight since being at work working.   Virtual Visit via Telephone Note  I connected with Kimberly Eaton on 02/18/20 at  2:00 PM EDT by telephone and verified that I am speaking with the correct person using two identifiers.  Location: Patient: home Provider: office   I discussed the limitations, risks, security and privacy concerns of performing an evaluation and management service by telephone and the availability of in person appointments. I also discussed with the patient that there may be a patient responsible charge related to this service. The patient expressed understanding and agreed to proceed.   History of Present Illness:    Observations/Objective:   Assessment and Plan:   Follow Up Instructions:    I discussed the assessment and treatment plan with the patient. The patient was provided an opportunity to ask questions and all were answered. The patient agreed with the plan and demonstrated an understanding of the instructions.   The patient was advised to call back or seek an in-person evaluation if the symptoms worsen or if the condition fails to improve as anticipated.  I provided 22 minutes of non-face-to-face time during this encounter.        Review of Systems No headache, no major weight loss or weight gain, no chest pain no back pain abdominal pain no change in bowel habits complete ROS otherwise negative     Objective:   Physical Exam  Virtual      Assessment & Plan:  Impression 1 essential hypertension.  Discussed.  Numbers elevated over the past 2 years in most clinical  settings.  Diet exercise discussed.  Norvasc initiated.  Goal parameters discussed.  Follow-up in 6 months

## 2020-02-25 ENCOUNTER — Telehealth: Payer: Self-pay | Admitting: Family Medicine

## 2020-02-25 NOTE — Telephone Encounter (Signed)
Patient is requesting new prescription for epipy pens she usually get four pens,two to carry with her and two at home to usage.She states the doctor she use to get prescription is at [facility] and wondering if you can renew prescription. [facility]

## 2020-02-28 MED ORDER — EPINEPHRINE 0.3 MG/0.3ML IJ SOAJ: 0.3000 mg | INTRAMUSCULAR | 0 refills | Status: AC | PRN

## 2020-02-28 NOTE — Telephone Encounter (Signed)
sure

## 2020-02-28 NOTE — Telephone Encounter (Signed)
Prescription sent electronically to pharmacy  Left message to return call 

## 2020-03-01 NOTE — Telephone Encounter (Signed)
Patient notified

## 2020-03-01 NOTE — Telephone Encounter (Signed)
Left another message for pt to return call

## 2020-03-17 ENCOUNTER — Other Ambulatory Visit: Payer: Self-pay

## 2020-03-17 ENCOUNTER — Other Ambulatory Visit (HOSPITAL_COMMUNITY)
Admission: RE | Admit: 2020-03-17 | Discharge: 2020-03-17 | Disposition: A | Discharge status: Home / Self Care | Payer: 59 | Source: Ambulatory Visit | Attending: Family Medicine | Admitting: Family Medicine

## 2020-03-17 DIAGNOSIS — R5383 Other fatigue: Secondary | ICD-10-CM | POA: Insufficient documentation

## 2020-03-17 DIAGNOSIS — I10 Essential (primary) hypertension: Secondary | ICD-10-CM | POA: Diagnosis present

## 2020-03-17 LAB — HEPATIC FUNCTION PANEL
ALT: 20 U/L (ref 0–44)
AST: 21 U/L (ref 15–41)
Albumin: 3.9 g/dL (ref 3.5–5.0)
Alkaline Phosphatase: 62 U/L (ref 38–126)
Bilirubin, Direct: 0.1 mg/dL (ref 0.0–0.2)
Indirect Bilirubin: 0.5 mg/dL (ref 0.3–0.9)
Total Bilirubin: 0.6 mg/dL (ref 0.3–1.2)
Total Protein: 6.4 g/dL — ABNORMAL LOW (ref 6.5–8.1)

## 2020-03-17 LAB — CBC WITH DIFFERENTIAL/PLATELET
Abs Immature Granulocytes: 0.02 10*3/uL (ref 0.00–0.07)
Basophils Absolute: 0.1 10*3/uL (ref 0.0–0.1)
Basophils Relative: 1 %
Eosinophils Absolute: 0.2 10*3/uL (ref 0.0–0.5)
Eosinophils Relative: 3 %
HCT: 43.5 % (ref 36.0–46.0)
Hemoglobin: 14 g/dL (ref 12.0–15.0)
Immature Granulocytes: 0 %
Lymphocytes Relative: 38 %
Lymphs Abs: 2.7 10*3/uL (ref 0.7–4.0)
MCH: 31.5 pg (ref 26.0–34.0)
MCHC: 32.2 g/dL (ref 30.0–36.0)
MCV: 97.8 fL (ref 80.0–100.0)
Monocytes Absolute: 0.4 10*3/uL (ref 0.1–1.0)
Monocytes Relative: 6 %
Neutro Abs: 3.7 10*3/uL (ref 1.7–7.7)
Neutrophils Relative %: 52 %
Platelets: 401 10*3/uL — ABNORMAL HIGH (ref 150–400)
RBC: 4.45 MIL/uL (ref 3.87–5.11)
RDW: 12 % (ref 11.5–15.5)
WBC: 7.1 10*3/uL (ref 4.0–10.5)
nRBC: 0 % (ref 0.0–0.2)

## 2020-03-17 LAB — BASIC METABOLIC PANEL
Anion gap: 9 (ref 5–15)
BUN: 13 mg/dL (ref 6–20)
CO2: 22 mmol/L (ref 22–32)
Calcium: 8.5 mg/dL — ABNORMAL LOW (ref 8.9–10.3)
Chloride: 107 mmol/L (ref 98–111)
Creatinine, Ser: 0.79 mg/dL (ref 0.44–1.00)
GFR calc Af Amer: >60 mL/min (ref >60)
GFR calc non Af Amer: >60 mL/min (ref >60)
Glucose, Bld: 92 mg/dL (ref 70–99)
Potassium: 4.4 mmol/L (ref 3.5–5.1)
Sodium: 138 mmol/L (ref 135–145)

## 2020-03-17 LAB — LIPID PANEL
Cholesterol: 195 mg/dL (ref 0–200)
HDL: 62 mg/dL (ref >40)
LDL Cholesterol: 125 mg/dL — ABNORMAL HIGH (ref 0–99)
Total CHOL/HDL Ratio: 3.1 RATIO
Triglycerides: 39 mg/dL (ref <150)
VLDL: 8 mg/dL (ref 0–40)

## 2020-03-17 LAB — TSH: TSH: 2.077 u[IU]/mL (ref 0.350–4.500)

## 2020-03-19 ENCOUNTER — Encounter: Payer: Self-pay | Admitting: Family Medicine

## 2020-10-11 ENCOUNTER — Other Ambulatory Visit: Payer: Self-pay | Admitting: *Deleted

## 2020-10-16 NOTE — Telephone Encounter (Signed)
Left message to set up appt.

## 2020-11-14 NOTE — Telephone Encounter (Signed)
This will not let us complete it, can you complete it?

## 2021-03-21 ENCOUNTER — Telehealth: Payer: Self-pay | Admitting: *Deleted

## 2021-03-21 NOTE — Telephone Encounter (Signed)
Transition Care Management Follow-up Telephone Call  Date of discharge and from where: 03/20/2021 [facility]  How have you been since you were released from the hospital? "Fine"  Any questions or concerns? No  Items Reviewed:  Did the pt receive and understand the discharge instructions provided? Yes   Medications obtained and verified? Yes   Other? No   Any new allergies since your discharge? No   Dietary orders reviewed? No  Do you have support at home? Yes    Functional Questionnaire: (I = Independent and D = Dependent) ADLs: I  Bathing/Dressing- I  Meal Prep- I  Eating- I  Maintaining continence- I  Transferring/Ambulation- I  Managing Meds- I  Follow up appointments reviewed:   PCP Hospital f/u appt confirmed? No    Specialist Hospital f/u appt confirmed? N/A   Are transportation arrangements needed? N/A  If their condition worsens, is the pt aware to call PCP or go to the Emergency Dept.? Yes  Was the patient provided with contact information for the PCP's office or ED? Yes  Was to pt encouraged to call back with questions or concerns? Yes
